# Patient Record
Sex: Female | Born: 1970 | Race: White | Hispanic: No | Marital: Married | Smoking: Former smoker
Health system: Southern US, Community
[De-identification: ages and names within clinical notes are randomized; demographics above are authoritative.]

## PROBLEM LIST (undated history)

## (undated) DIAGNOSIS — F419 Anxiety disorder, unspecified: Secondary | ICD-10-CM

## (undated) DIAGNOSIS — D219 Benign neoplasm of connective and other soft tissue, unspecified: Secondary | ICD-10-CM

## (undated) DIAGNOSIS — K219 Gastro-esophageal reflux disease without esophagitis: Secondary | ICD-10-CM

## (undated) DIAGNOSIS — J302 Other seasonal allergic rhinitis: Secondary | ICD-10-CM

## (undated) DIAGNOSIS — R519 Headache, unspecified: Secondary | ICD-10-CM

## (undated) DIAGNOSIS — R51 Headache: Secondary | ICD-10-CM

## (undated) DIAGNOSIS — F909 Attention-deficit hyperactivity disorder, unspecified type: Secondary | ICD-10-CM

## (undated) DIAGNOSIS — F329 Major depressive disorder, single episode, unspecified: Secondary | ICD-10-CM

## (undated) DIAGNOSIS — F32A Depression, unspecified: Secondary | ICD-10-CM

## (undated) DIAGNOSIS — B009 Herpesviral infection, unspecified: Secondary | ICD-10-CM

## (undated) DIAGNOSIS — T50901A Poisoning by unspecified drugs, medicaments and biological substances, accidental (unintentional), initial encounter: Secondary | ICD-10-CM

## (undated) HISTORY — PX: TONSILLECTOMY: SUR1361

## (undated) HISTORY — DX: Gastro-esophageal reflux disease without esophagitis: K21.9

---

## 1997-08-23 ENCOUNTER — Other Ambulatory Visit: Admission: RE | Admit: 1997-08-23 | Discharge: 1997-08-23 | Payer: Self-pay | Admitting: Obstetrics & Gynecology

## 1998-11-07 ENCOUNTER — Other Ambulatory Visit: Admission: RE | Admit: 1998-11-07 | Discharge: 1998-11-07 | Payer: Self-pay | Admitting: Obstetrics & Gynecology

## 1999-12-18 ENCOUNTER — Emergency Department (HOSPITAL_COMMUNITY): Admission: EM | Admit: 1999-12-18 | Discharge: 1999-12-18 | Payer: Self-pay | Admitting: Emergency Medicine

## 2002-06-11 ENCOUNTER — Other Ambulatory Visit: Admission: RE | Admit: 2002-06-11 | Discharge: 2002-06-11 | Payer: Self-pay | Admitting: Obstetrics & Gynecology

## 2003-06-29 ENCOUNTER — Other Ambulatory Visit: Admission: RE | Admit: 2003-06-29 | Discharge: 2003-06-29 | Payer: Self-pay | Admitting: Obstetrics & Gynecology

## 2004-07-01 ENCOUNTER — Ambulatory Visit (HOSPITAL_COMMUNITY): Admission: AD | Admit: 2004-07-01 | Discharge: 2004-07-01 | Payer: Self-pay | Admitting: Obstetrics and Gynecology

## 2004-10-02 ENCOUNTER — Other Ambulatory Visit: Admission: RE | Admit: 2004-10-02 | Discharge: 2004-10-02 | Payer: Self-pay | Admitting: Obstetrics and Gynecology

## 2005-04-15 HISTORY — PX: OTHER SURGICAL HISTORY: SHX169

## 2005-05-13 ENCOUNTER — Inpatient Hospital Stay (HOSPITAL_COMMUNITY): Admission: RE | Admit: 2005-05-13 | Discharge: 2005-05-13 | Payer: Self-pay | Admitting: Obstetrics and Gynecology

## 2005-08-07 ENCOUNTER — Inpatient Hospital Stay (HOSPITAL_COMMUNITY): Admission: AD | Admit: 2005-08-07 | Discharge: 2005-08-10 | Payer: Self-pay | Admitting: Obstetrics and Gynecology

## 2010-10-25 ENCOUNTER — Other Ambulatory Visit: Payer: Self-pay | Admitting: Obstetrics and Gynecology

## 2012-05-26 ENCOUNTER — Emergency Department (HOSPITAL_COMMUNITY)
Admission: EM | Admit: 2012-05-26 | Discharge: 2012-05-26 | Disposition: A | Discharge status: Home / Self Care | Payer: BC Managed Care – PPO | Attending: Emergency Medicine | Admitting: Emergency Medicine

## 2012-05-26 ENCOUNTER — Encounter (HOSPITAL_COMMUNITY): Payer: Self-pay | Admitting: Neurology

## 2012-05-26 DIAGNOSIS — R110 Nausea: Secondary | ICD-10-CM | POA: Insufficient documentation

## 2012-05-26 DIAGNOSIS — R5381 Other malaise: Secondary | ICD-10-CM | POA: Insufficient documentation

## 2012-05-26 DIAGNOSIS — F329 Major depressive disorder, single episode, unspecified: Secondary | ICD-10-CM | POA: Insufficient documentation

## 2012-05-26 DIAGNOSIS — Z79899 Other long term (current) drug therapy: Secondary | ICD-10-CM | POA: Insufficient documentation

## 2012-05-26 DIAGNOSIS — R42 Dizziness and giddiness: Secondary | ICD-10-CM | POA: Insufficient documentation

## 2012-05-26 DIAGNOSIS — T50991A Poisoning by other drugs, medicaments and biological substances, accidental (unintentional), initial encounter: Secondary | ICD-10-CM | POA: Insufficient documentation

## 2012-05-26 DIAGNOSIS — Y9389 Activity, other specified: Secondary | ICD-10-CM | POA: Insufficient documentation

## 2012-05-26 DIAGNOSIS — Y929 Unspecified place or not applicable: Secondary | ICD-10-CM | POA: Insufficient documentation

## 2012-05-26 DIAGNOSIS — F3289 Other specified depressive episodes: Secondary | ICD-10-CM | POA: Insufficient documentation

## 2012-05-26 DIAGNOSIS — F909 Attention-deficit hyperactivity disorder, unspecified type: Secondary | ICD-10-CM | POA: Insufficient documentation

## 2012-05-26 DIAGNOSIS — T50901A Poisoning by unspecified drugs, medicaments and biological substances, accidental (unintentional), initial encounter: Secondary | ICD-10-CM

## 2012-05-26 HISTORY — DX: Major depressive disorder, single episode, unspecified: F32.9

## 2012-05-26 HISTORY — DX: Attention-deficit hyperactivity disorder, unspecified type: F90.9

## 2012-05-26 HISTORY — DX: Depression, unspecified: F32.A

## 2012-05-26 NOTE — ED Notes (Signed)
Pt is alert and oriented. HR 80, denies pain. Speech is clear. Pt is ambulatory, moving all extremities. Discharged with family, pt husband states he will be staying with her today for observation. Pt told to monitor HR, return for any concerning s/s. Pt and family verbalized understanding.

## 2012-05-26 NOTE — ED Provider Notes (Signed)
History     CSN: [id number]  Arrival date & time 05/26/12  1191   First MD Initiated Contact with Patient 05/26/12 [phone]      Chief Complaint  Patient presents with  . Drug Overdose    (Consider location/radiation/quality/duration/timing/severity/associated sxs/prior treatment) Patient is a 42 y.o. female presenting with Overdose. The history is provided by the patient.  Drug Overdose This is a new problem. The current episode started today. The problem occurs rarely. The problem has been unchanged. Associated symptoms include nausea and weakness. Pertinent negatives include no abdominal pain, arthralgias, chest pain, congestion, fatigue, fever, headaches, rash or vomiting. Nothing aggravates the symptoms. She has tried nothing for the symptoms.    Past Medical History  Diagnosis Date  . ADHD (attention deficit hyperactivity disorder)   . Depression     Past Surgical History  Procedure Laterality Date  . Cesarean section      No family history on file.  History  Substance Use Topics  . Smoking status: Never Smoker   . Smokeless tobacco: Not on file  . Alcohol Use: Yes    OB History   Grav Para Term Preterm Abortions TAB SAB Ect Mult Living                  Review of Systems  Constitutional: Negative for fever and fatigue.  HENT: Negative for congestion, rhinorrhea and postnasal drip.   Eyes: Negative for photophobia and visual disturbance.  Respiratory: Negative for chest tightness, shortness of breath and wheezing.   Cardiovascular: Negative for chest pain, palpitations and leg swelling.  Gastrointestinal: Positive for nausea. Negative for vomiting, abdominal pain and diarrhea.  Genitourinary: Negative for urgency, frequency and difficulty urinating.  Musculoskeletal: Negative for back pain and arthralgias.  Skin: Negative for rash and wound.  Neurological: Positive for weakness and light-headedness. Negative for headaches.  Psychiatric/Behavioral: Negative  for confusion and agitation.    Allergies  Reglan  Home Medications   Current Outpatient Rx  Name  Route  Sig  Dispense  Refill  . amphetamine-dextroamphetamine (ADDERALL XR) 10 MG 24 hr capsule   Oral   Take 10 mg by mouth every morning.         Marland Kitchen amphetamine-dextroamphetamine (ADDERALL XR) 30 MG 24 hr capsule   Oral   Take 30 mg by mouth every morning.         Marland Kitchen atomoxetine (STRATTERA) 100 MG capsule   Oral   Take 100 mg by mouth daily.         Marland Kitchen desvenlafaxine (PRISTIQ) 50 MG 24 hr tablet   Oral   Take 50 mg by mouth daily.         Marland Kitchen ibuprofen (ADVIL,MOTRIN) 200 MG tablet   Oral   Take 400 mg by mouth every 6 (six) hours as needed for pain (for pain).         . SUMAtriptan (IMITREX) 100 MG tablet   Oral   Take 100 mg by mouth every 2 (two) hours as needed for migraine (for migraine).         . valACYclovir (VALTREX) 500 MG tablet   Oral   Take 250 mg by mouth daily.           BP 117/84  Pulse 82  Resp 18  SpO2 99%  LMP 05/25/2012  Physical Exam  Nursing note and vitals reviewed. Constitutional: She is oriented to person, place, and time. She appears well-developed and well-nourished. No distress.  HENT:  Head: Normocephalic  and atraumatic.  Mouth/Throat: Oropharynx is clear and moist.  Eyes: EOM are normal. Pupils are equal, round, and reactive to light.  Neck: Normal range of motion. Neck supple.  Cardiovascular: Normal rate, regular rhythm, normal heart sounds and intact distal pulses.   Pulmonary/Chest: Effort normal and breath sounds normal. She has no wheezes. She has no rales.  Abdominal: Soft. Bowel sounds are normal. She exhibits no distension. There is no tenderness. There is no rebound and no guarding.  Musculoskeletal: Normal range of motion. She exhibits no edema and no tenderness.  Lymphadenopathy:    She has no cervical adenopathy.  Neurological: She is alert and oriented to person, place, and time. She displays normal  reflexes. No cranial nerve deficit. She exhibits normal muscle tone. Coordination normal.  Skin: Skin is warm and dry. No rash noted.  Psychiatric: She has a normal mood and affect. Her behavior is normal.    ED Course  Procedures (including critical care time)  Labs Reviewed - No data to display No results found.   1. Accidental drug ingestion       MDM  25F here due to accidental ingestion of sumatriptan. She states she ingested eight 100 mg tablets about 3 hours prior to arrival. She thought she was taking her morning medications but instead she took the bottle of Imitrex. She is not suicidal or homicidal. She currently feels sleepy and slightly nauseated. Exam is unremarkable. I spoke with poison control who states there is no risk to serotonin syndrome or any other serious sequela. Recommended to wait for her pulse and BP to improve some before discharge, which was 99 and 147/88/  BP and HR now 117/84 and 82. Pt still feeling ok and stable for d/c home. Return precautions given.        Johnnette Gourd, MD 05/26/12 1359

## 2012-05-26 NOTE — ED Notes (Signed)
Pt states took 8 100mg  Sumatriptan tablets this morning by mistake. Usual dose is 1 100 mg tablet at night. States feeling fuzzy, tired and emotional. Pt is alert and oriented. Vitals stable. Family at bedside.

## 2012-05-29 NOTE — ED Provider Notes (Signed)
I saw and evaluated the patient, reviewed the resident's note and I agree with the findings and plan.   .Face to face Exam:  General:  Awake HEENT:  Atraumatic Resp:  Normal effort Abd:  Nondistended Neuro:No focal weakness Lymph: No adenopathy   Conna Terada L Taylore Hinde, MD 05/29/12 1128 

## 2013-02-11 ENCOUNTER — Encounter (HOSPITAL_COMMUNITY): Payer: Self-pay | Admitting: Emergency Medicine

## 2013-02-11 ENCOUNTER — Emergency Department (HOSPITAL_COMMUNITY)
Admission: EM | Admit: 2013-02-11 | Discharge: 2013-02-11 | Disposition: A | Discharge status: Home / Self Care | Payer: BC Managed Care – PPO | Attending: Emergency Medicine | Admitting: Emergency Medicine

## 2013-02-11 DIAGNOSIS — F3289 Other specified depressive episodes: Secondary | ICD-10-CM | POA: Insufficient documentation

## 2013-02-11 DIAGNOSIS — Y9289 Other specified places as the place of occurrence of the external cause: Secondary | ICD-10-CM | POA: Insufficient documentation

## 2013-02-11 DIAGNOSIS — T50901A Poisoning by unspecified drugs, medicaments and biological substances, accidental (unintentional), initial encounter: Secondary | ICD-10-CM

## 2013-02-11 DIAGNOSIS — Z3202 Encounter for pregnancy test, result negative: Secondary | ICD-10-CM | POA: Insufficient documentation

## 2013-02-11 DIAGNOSIS — R42 Dizziness and giddiness: Secondary | ICD-10-CM | POA: Insufficient documentation

## 2013-02-11 DIAGNOSIS — F329 Major depressive disorder, single episode, unspecified: Secondary | ICD-10-CM | POA: Insufficient documentation

## 2013-02-11 DIAGNOSIS — Y939 Activity, unspecified: Secondary | ICD-10-CM | POA: Insufficient documentation

## 2013-02-11 DIAGNOSIS — Z79899 Other long term (current) drug therapy: Secondary | ICD-10-CM | POA: Insufficient documentation

## 2013-02-11 DIAGNOSIS — F909 Attention-deficit hyperactivity disorder, unspecified type: Secondary | ICD-10-CM | POA: Insufficient documentation

## 2013-02-11 DIAGNOSIS — T44901A Poisoning by unspecified drugs primarily affecting the autonomic nervous system, accidental (unintentional), initial encounter: Secondary | ICD-10-CM | POA: Insufficient documentation

## 2013-02-11 LAB — CBC WITH DIFFERENTIAL/PLATELET
Basophils Absolute: 0 10*3/uL (ref 0.0–0.1)
HCT: 37.7 % (ref 36.0–46.0)
Hemoglobin: 13.2 g/dL (ref 12.0–15.0)
Lymphocytes Relative: 10 % — ABNORMAL LOW (ref 12–46)
Lymphs Abs: 1.1 10*3/uL (ref 0.7–4.0)
MCH: 31.3 pg (ref 26.0–34.0)
Monocytes Absolute: 0.3 10*3/uL (ref 0.1–1.0)
Monocytes Relative: 3 % (ref 3–12)
Neutro Abs: 9.4 10*3/uL — ABNORMAL HIGH (ref 1.7–7.7)
Neutrophils Relative %: 87 % — ABNORMAL HIGH (ref 43–77)
Platelets: 287 10*3/uL (ref 150–400)
WBC: 10.9 10*3/uL — ABNORMAL HIGH (ref 4.0–10.5)

## 2013-02-11 LAB — COMPREHENSIVE METABOLIC PANEL
ALT: 16 U/L (ref 0–35)
AST: 19 U/L (ref 0–37)
Albumin: 3.8 g/dL (ref 3.5–5.2)
Alkaline Phosphatase: 67 U/L (ref 39–117)
BUN: 10 mg/dL (ref 6–23)
GFR calc Af Amer: >90 mL/min (ref >90)
GFR calc non Af Amer: >90 mL/min (ref >90)
Sodium: 135 mEq/L (ref 135–145)

## 2013-02-11 LAB — ACETAMINOPHEN LEVEL: Acetaminophen (Tylenol), Serum: <15 ug/mL (ref 10–30)

## 2013-02-11 MED ORDER — SODIUM CHLORIDE 0.9 % IV BOLUS (SEPSIS)
2000.0000 mL | Freq: Once | INTRAVENOUS | Status: AC
  Administered 2013-02-11: 1000 mL via INTRAVENOUS

## 2013-02-11 NOTE — ED Provider Notes (Signed)
CSN: [id number]     Arrival date & time 02/11/13  0935 History   First MD Initiated Contact with Patient 02/11/13 [phone]     Chief Complaint  Patient presents with  . Drug Overdose   (Consider location/radiation/quality/duration/timing/severity/associated sxs/prior Treatment) HPI This 42 year old female presents with accidental drug ingestion overdose today. She said the wrong pill bottle out by her bedside this morning. Usually she has a full bottle by her bedside at 5:30 in the morning she takes multiple medications. Today at 5:30 she acts in mind we took the medications out of a bottle by her bedside, she then woke up later to pick is ready for school, she felt lightheaded at that time so laid back down again, she woke up again this morning and realized when she looked at the pool bottle by her bedside it was not her usual morning medicine until bottle it was instead her bottle of Strattera pills and she had taken multiple Strattera pills by accident. She feels generally weak and shaky with decreased concentration this morning and lightheadedness but has no vertigo no altered mental status no fever no change in speech or vision swallowing or understanding and no chest pain shortness breath abdominal pain vomiting no suicidal or homicidal ideation no hallucinations no syncope. There is no treatment prior to arrival. Past Medical History  Diagnosis Date  . ADHD (attention deficit hyperactivity disorder)   . Depression    Past Surgical History  Procedure Laterality Date  . Cesarean section     No family history on file. History  Substance Use Topics  . Smoking status: Never Smoker   . Smokeless tobacco: Not on file  . Alcohol Use: Yes   OB History   Grav Para Term Preterm Abortions TAB SAB Ect Mult Living                 Review of Systems 10 Systems reviewed and are negative for acute change except as noted in the HPI. Allergies  Reglan  Home Medications   Current Outpatient Rx   Name  Route  Sig  Dispense  Refill  . ALPRAZolam (XANAX) 0.25 MG tablet   Oral   Take 0.25 mg by mouth 3 (three) times daily as needed for anxiety.         Marland Kitchen amphetamine-dextroamphetamine (ADDERALL XR) 10 MG 24 hr capsule   Oral   Take 10 mg by mouth every morning.         Marland Kitchen amphetamine-dextroamphetamine (ADDERALL XR) 30 MG 24 hr capsule   Oral   Take 30 mg by mouth every morning.         Marland Kitchen atomoxetine (STRATTERA) 100 MG capsule   Oral   Take 100 mg by mouth daily.         Marland Kitchen desvenlafaxine (PRISTIQ) 50 MG 24 hr tablet   Oral   Take 50 mg by mouth daily.         Marland Kitchen FLUoxetine (PROZAC) 10 MG capsule   Oral   Take 10 mg by mouth daily as needed (Takes during menstrual cycle).         Marland Kitchen ibuprofen (ADVIL,MOTRIN) 200 MG tablet   Oral   Take 400 mg by mouth every 6 (six) hours as needed for pain (for pain).         . SUMAtriptan (IMITREX) 100 MG tablet   Oral   Take 100 mg by mouth every 2 (two) hours as needed for migraine. May repeat in 2 hours if  headache persists or recurs.         . valACYclovir (VALTREX) 500 MG tablet   Oral   Take 250 mg by mouth daily.          BP 112/73  Pulse 80  Temp(Src) 97.8 F (36.6 C) (Oral)  Resp 15  Ht 5\' 4"  (1.626 m)  Wt 140 lb (63.504 kg)  BMI 24.02 kg/m2  SpO2 100% Physical Exam  Nursing note and vitals reviewed. Constitutional:  Awake, alert, nontoxic appearance.  HENT:  Head: Atraumatic.  Eyes: Right eye exhibits no discharge. Left eye exhibits no discharge.  Neck: Neck supple.  Cardiovascular: Normal rate and regular rhythm.   No murmur heard. Pulmonary/Chest: Effort normal and breath sounds normal. No respiratory distress. She has no wheezes. She has no rales. She exhibits no tenderness.  Abdominal: Soft. Bowel sounds are normal. She exhibits no distension. There is no tenderness. There is no rebound and no guarding.  Musculoskeletal: She exhibits no tenderness.  Baseline ROM, no obvious new focal  weakness.  Neurological: She is alert.  Mental status and motor strength appears baseline for patient and situation.  Skin: No rash noted.  Psychiatric: She has a normal mood and affect.    ED Course  Procedures (including critical care time) Patient / Family / Caregiver informed of clinical course, understand medical decision-making process, and agree with plan.Pt stable in ED with no significant deterioration in condition.Pt feels improved after observation and/or treatment in ED. Labs Review Labs Reviewed  CBC WITH DIFFERENTIAL - Abnormal; Notable for the following:    WBC 10.9 (*)    Neutrophils Relative % 87 (*)    Neutro Abs 9.4 (*)    Lymphocytes Relative 10 (*)    All other components within normal limits  SALICYLATE LEVEL - Abnormal; Notable for the following:    Salicylate Lvl <2.0 (*)    All other components within normal limits  COMPREHENSIVE METABOLIC PANEL  ACETAMINOPHEN LEVEL  CK  POCT PREGNANCY, URINE   Imaging Review No results found.  EKG Interpretation     Ventricular Rate:  86 PR Interval:  141 QRS Duration: 85 QT Interval:  406 QTC Calculation: 486 R Axis:   85 Text Interpretation:  Sinus rhythm Low voltage, precordial leads Nonspecific T abnormalities, lateral leads Borderline prolonged QT interval No previous ECGs available            MDM   1. Accidental overdose, initial encounter    I doubt any other EMC precluding discharge at this time including, but not necessarily limited to the following:SI.    Hurman Horn, MD 02/14/13 2023

## 2013-02-11 NOTE — ED Notes (Signed)
Unable to get temperature.   Tried oral and axillary.  Patient feels cool to the touch.

## 2013-02-11 NOTE — ED Notes (Signed)
Pt stated she felt whoozy when standing to use the bedside commode. Reported to nurse [facility]

## 2013-02-11 NOTE — ED Notes (Signed)
Pt ambulated to restroom with no issues.  

## 2013-02-11 NOTE — ED Notes (Signed)
Spoke with poison control--- states ok to discharge if no further symptoms

## 2013-02-11 NOTE — ED Notes (Addendum)
Spoke to [place] from [employer] regarding plan of monitoring/care. Dr. Fonnie Jarvis and patient updated.

## 2013-02-11 NOTE — ED Notes (Addendum)
Per EMS: Pt took an 7 pills Strattera, for a total of 700 mg at 0500 this AM. Pt reports it was mixed in with normal medications and was accidental. Pt also took Valtrex and Aderall this AM. 130/96. 100 bpm. 100% RA. BG 237. AO x4, mild lethargy noted.

## 2013-04-06 ENCOUNTER — Other Ambulatory Visit: Payer: Self-pay | Admitting: Obstetrics and Gynecology

## 2014-05-04 ENCOUNTER — Other Ambulatory Visit: Payer: Self-pay | Admitting: Obstetrics and Gynecology

## 2014-05-05 LAB — CYTOLOGY - PAP

## 2014-07-29 NOTE — Patient Instructions (Addendum)
   Your procedure is scheduled on:  Wednesday, April 27  Enter through the [employer] [facility] at: 6 AM Pick up the phone at the desk and dial [phone] and inform us of your arrival.  Please call this number if you have any problems the morning of surgery: [phone]  Remember: Do not eat or drink after midnight: Tuesday Take these medicines the morning of surgery with a SIP OF WATER: clarinex if needed, pristiq, prozac, valtrex and xanax if needed.  Do not wear jewelry, make-up, or FINGER nail polish No metal in your hair or on your body. Do not wear lotions, powders, perfumes.  You may wear deodorant.  Do not bring valuables to the hospital. Contacts, dentures or bridgework may not be worn into surgery.  Leave suitcase in the car. After Surgery it may be brought to your room. For patients being admitted to the hospital, checkout time is 11:00am the day of discharge.  Home with husband Percell Miller cell [phone]

## 2014-08-02 ENCOUNTER — Encounter (HOSPITAL_COMMUNITY): Payer: Self-pay

## 2014-08-02 ENCOUNTER — Encounter (HOSPITAL_COMMUNITY)
Admission: RE | Admit: 2014-08-02 | Discharge: 2014-08-02 | Disposition: A | Discharge status: Home / Self Care | Payer: BLUE CROSS/BLUE SHIELD | Source: Ambulatory Visit | Attending: Obstetrics and Gynecology | Admitting: Obstetrics and Gynecology

## 2014-08-02 DIAGNOSIS — Z01818 Encounter for other preprocedural examination: Secondary | ICD-10-CM | POA: Insufficient documentation

## 2014-08-02 HISTORY — DX: Other seasonal allergic rhinitis: J30.2

## 2014-08-02 HISTORY — DX: Headache: R51

## 2014-08-02 HISTORY — DX: Anxiety disorder, unspecified: F41.9

## 2014-08-02 HISTORY — DX: Poisoning by unspecified drugs, medicaments and biological substances, accidental (unintentional), initial encounter: T50.901A

## 2014-08-02 HISTORY — DX: Headache, unspecified: R51.9

## 2014-08-02 HISTORY — DX: Benign neoplasm of connective and other soft tissue, unspecified: D21.9

## 2014-08-02 HISTORY — DX: Herpesviral infection, unspecified: B00.9

## 2014-08-02 LAB — CBC
HCT: 33.6 % — ABNORMAL LOW (ref 36.0–46.0)
HEMOGLOBIN: 10.8 g/dL — AB (ref 12.0–15.0)
MCH: 26.9 pg (ref 26.0–34.0)
MCHC: 32.1 g/dL (ref 30.0–36.0)
MCV: 83.8 fL (ref 78.0–100.0)
Platelets: 389 10*3/uL (ref 150–400)
RBC: 4.01 MIL/uL (ref 3.87–5.11)
RDW: 13.6 % (ref 11.5–15.5)
WBC: 5.7 10*3/uL (ref 4.0–10.5)

## 2014-08-04 ENCOUNTER — Other Ambulatory Visit: Payer: Self-pay | Admitting: Obstetrics and Gynecology

## 2014-08-04 NOTE — H&P (Signed)
44 y.o. yo complains of severe heavy periods and symptomatic fibroid uterus.  Pt. c/o painful and heavy cycles/ tb//. Mirena inserted in 10-2010. Periods are about same - consistently heavy, but not worse. However, cramps are worsening. Tampons are falling out because of how heavy. Using cup and at least doesn't leak all the way through. Prozac helps PMDD. Has migraines- may worsen with OCPs.  She also c/o weakness and fatigue.  [place] 9x6x7; large 5 cm fibroid submucosal displacing EM; IUD was just inside internal OS- IUD was removed.  Period again right after last visit. Then had almost like hemorrhage. Feels very fatigued. FEels really bad.She does not have significant SUI sx.  Past Medical History  Diagnosis Date  . ADHD (attention deficit hyperactivity disorder)   . Depression   . Drug overdose 05/26/12, 02/11/13    history x 2 - accidental drug overdose  . Anxiety   . Herpes   . Headache   . Seasonal allergies   . Fibroids    Past Surgical History  Procedure Laterality Date  . Cesarean section      x 1  . Tonsillectomy    . Right foot surgery  2007    History   Social History  . Marital Status: Single    Spouse Name: N/A  . Number of Children: N/A  . Years of Education: N/A   Occupational History  . Not on file.   Social History Main Topics  . Smoking status: Former Smoker -- 0.25 packs/day for 10 years    Types: Cigarettes    Quit date: 04/16/2003  . Smokeless tobacco: Never Used  . Alcohol Use: 8.4 oz/week    14 Glasses of wine per week     Comment: daily wine/beer   . Drug Use: Yes    Special: Marijuana     Comment: Last use April 2016  . Sexual Activity: Yes    Birth Control/ Protection: None   Other Topics Concern  . Not on file   Social History Narrative    No current facility-administered medications on file prior to encounter.   Current Outpatient Prescriptions on File Prior to Encounter  Medication Sig Dispense Refill  . ALPRAZolam (XANAX) 0.25 MG  tablet Take 0.25 mg by mouth 3 (three) times daily as needed for anxiety.    Marland Kitchen amphetamine-dextroamphetamine (ADDERALL XR) 10 MG 24 hr capsule Take 10 mg by mouth every morning.    Marland Kitchen amphetamine-dextroamphetamine (ADDERALL XR) 30 MG 24 hr capsule Take 30 mg by mouth every morning.    Marland Kitchen atomoxetine (STRATTERA) 100 MG capsule Take 100 mg by mouth daily.    Marland Kitchen desvenlafaxine (PRISTIQ) 50 MG 24 hr tablet Take 50 mg by mouth daily.    Marland Kitchen FLUoxetine (PROZAC) 10 MG capsule Take 10 mg by mouth daily as needed (Takes during menstrual cycle).    Marland Kitchen ibuprofen (ADVIL,MOTRIN) 200 MG tablet Take 400 mg by mouth every 6 (six) hours as needed for pain (for pain).    . SUMAtriptan (IMITREX) 100 MG tablet Take 100 mg by mouth every 2 (two) hours as needed for migraine. May repeat in 2 hours if headache persists or recurs.    . valACYclovir (VALTREX) 500 MG tablet Take 250-500 mg by mouth See admin instructions. Takes 250 mg daily and takes 500 mg for outbreaks.      Allergies  Allergen Reactions  . Morphine And Related Itching  . Reglan [Metoclopramide] Nausea And Vomiting and Other (See Comments)    hyperactivity    @  [id number]@  Lungs: clear to ascultation Cor:  RRR Abdomen:  soft, nontender, nondistended. Ex:  no cords, erythema Pelvic: Female Genitalia: Vulva: no masses, atrophy, or lesions. Vagina: no tenderness, erythema, cystocele, rectocele, abnormal vaginal discharge, or vesicle(s) or ulcers. Cervix: no discharge or cervical motion tenderness and grossly normal and sample taken for a Pap smear. Uterus: normal shape and size (7; strings NOT seen) and midline, non-tender, and no uterine prolapse. Bladder/Urethra: no urethral discharge or mass and normal meatus, bladder non distended, and Urethra well supported. Adnexa/Parametria: no parametrial tenderness or mass and no adnexal tenderness or ovarian mass.   A:  Symptomatic fibroid uterus with 5 cm submucosal fibroid and IUD that was removed after slipping  out of place.  Does not tolerate OCPs  For Robotic assisted TLH/salpingectomies/poss BSO/cysto.    P: All risks, benefits and alternatives d/w patient and she desires to proceed.  Patient has undergone a modified bowel prep and will receive preop antibiotics and SCDs during the operation.     Chrishawn Kring A

## 2014-08-09 MED ORDER — DEXTROSE 5 % IV SOLN
2.0000 g | INTRAVENOUS | Status: AC
  Administered 2014-08-10: 2 g via INTRAVENOUS
  Filled 2014-08-09: qty 2

## 2014-08-10 ENCOUNTER — Ambulatory Visit (HOSPITAL_COMMUNITY)
Admission: RE | Admit: 2014-08-10 | Discharge: 2014-08-11 | Disposition: A | Discharge status: Home / Self Care | Payer: BLUE CROSS/BLUE SHIELD | Source: Ambulatory Visit | Attending: Obstetrics and Gynecology | Admitting: Obstetrics and Gynecology

## 2014-08-10 ENCOUNTER — Ambulatory Visit (HOSPITAL_COMMUNITY): Payer: BLUE CROSS/BLUE SHIELD | Admitting: Anesthesiology

## 2014-08-10 ENCOUNTER — Encounter (HOSPITAL_COMMUNITY)
Admission: RE | Disposition: A | Discharge status: Home / Self Care | Payer: Self-pay | Source: Ambulatory Visit | Attending: Obstetrics and Gynecology

## 2014-08-10 ENCOUNTER — Encounter (HOSPITAL_COMMUNITY): Payer: Self-pay | Admitting: *Deleted

## 2014-08-10 DIAGNOSIS — Z9889 Other specified postprocedural states: Secondary | ICD-10-CM

## 2014-08-10 DIAGNOSIS — Z885 Allergy status to narcotic agent status: Secondary | ICD-10-CM | POA: Insufficient documentation

## 2014-08-10 DIAGNOSIS — F329 Major depressive disorder, single episode, unspecified: Secondary | ICD-10-CM | POA: Diagnosis not present

## 2014-08-10 DIAGNOSIS — Z87891 Personal history of nicotine dependence: Secondary | ICD-10-CM | POA: Diagnosis not present

## 2014-08-10 DIAGNOSIS — F1210 Cannabis abuse, uncomplicated: Secondary | ICD-10-CM | POA: Diagnosis not present

## 2014-08-10 DIAGNOSIS — D259 Leiomyoma of uterus, unspecified: Secondary | ICD-10-CM | POA: Insufficient documentation

## 2014-08-10 DIAGNOSIS — N920 Excessive and frequent menstruation with regular cycle: Secondary | ICD-10-CM | POA: Diagnosis present

## 2014-08-10 DIAGNOSIS — Z888 Allergy status to other drugs, medicaments and biological substances status: Secondary | ICD-10-CM | POA: Insufficient documentation

## 2014-08-10 DIAGNOSIS — F419 Anxiety disorder, unspecified: Secondary | ICD-10-CM | POA: Diagnosis not present

## 2014-08-10 HISTORY — PX: CYSTOSCOPY: SHX5120

## 2014-08-10 HISTORY — PX: ROBOTIC ASSISTED TOTAL HYSTERECTOMY: SHX6085

## 2014-08-10 HISTORY — PX: BILATERAL SALPINGECTOMY: SHX5743

## 2014-08-10 LAB — PREGNANCY, URINE: Preg Test, Ur: NEGATIVE

## 2014-08-10 SURGERY — ROBOTIC ASSISTED TOTAL HYSTERECTOMY
Anesthesia: General | Site: Urethra

## 2014-08-10 MED ORDER — ONDANSETRON HCL 4 MG/2ML IJ SOLN: 4.0000 mg | Freq: Four times a day (QID) | INTRAMUSCULAR | Status: DC | PRN

## 2014-08-10 MED ORDER — ATOMOXETINE HCL 60 MG PO CAPS: 100.0000 mg | ORAL_CAPSULE | Freq: Every day | ORAL | Status: DC

## 2014-08-10 MED ORDER — PROPOFOL 10 MG/ML IV BOLUS
INTRAVENOUS | Status: DC | PRN
  Administered 2014-08-10: 160 mg via INTRAVENOUS

## 2014-08-10 MED ORDER — LACTATED RINGERS IR SOLN
Status: DC | PRN
Start: 2014-08-10 — End: 2014-08-10
  Administered 2014-08-10: 3000 mL

## 2014-08-10 MED ORDER — ALPRAZOLAM 0.25 MG PO TABS: 0.2500 mg | ORAL_TABLET | Freq: Three times a day (TID) | ORAL | Status: DC | PRN

## 2014-08-10 MED ORDER — SUMATRIPTAN SUCCINATE 100 MG PO TABS
100.0000 mg | ORAL_TABLET | ORAL | Status: DC | PRN
  Filled 2014-08-10: qty 1

## 2014-08-10 MED ORDER — STERILE WATER FOR IRRIGATION IR SOLN
Status: DC | PRN
  Administered 2014-08-10: 3000 mL

## 2014-08-10 MED ORDER — FENTANYL CITRATE (PF) 100 MCG/2ML IJ SOLN
INTRAMUSCULAR | Status: AC
  Filled 2014-08-10: qty 2

## 2014-08-10 MED ORDER — HYDROMORPHONE HCL 1 MG/ML IJ SOLN
INTRAMUSCULAR | Status: AC
Start: 2014-08-10 — End: 2014-08-10
  Filled 2014-08-10: qty 1

## 2014-08-10 MED ORDER — BUPIVACAINE LIPOSOME 1.3 % IJ SUSP
20.0000 mL | Freq: Once | INTRAMUSCULAR | Status: AC
  Administered 2014-08-10: 20 mL
  Filled 2014-08-10: qty 20

## 2014-08-10 MED ORDER — LIDOCAINE HCL (CARDIAC) 20 MG/ML IV SOLN
INTRAVENOUS | Status: AC
  Filled 2014-08-10: qty 5

## 2014-08-10 MED ORDER — AMPHETAMINE-DEXTROAMPHET ER 30 MG PO CP24: 30.0000 mg | ORAL_CAPSULE | Freq: Every morning | ORAL | Status: DC

## 2014-08-10 MED ORDER — MIDAZOLAM HCL 2 MG/2ML IJ SOLN
INTRAMUSCULAR | Status: DC | PRN
  Administered 2014-08-10: 2 mg via INTRAVENOUS

## 2014-08-10 MED ORDER — ONDANSETRON HCL 4 MG/2ML IJ SOLN
INTRAMUSCULAR | Status: AC
  Filled 2014-08-10: qty 2

## 2014-08-10 MED ORDER — ONDANSETRON HCL 4 MG PO TABS: 4.0000 mg | ORAL_TABLET | Freq: Four times a day (QID) | ORAL | Status: DC | PRN

## 2014-08-10 MED ORDER — NEOSTIGMINE METHYLSULFATE 10 MG/10ML IV SOLN
INTRAVENOUS | Status: AC
  Filled 2014-08-10: qty 1

## 2014-08-10 MED ORDER — DEXAMETHASONE SODIUM PHOSPHATE 4 MG/ML IJ SOLN
INTRAMUSCULAR | Status: AC
  Filled 2014-08-10: qty 1

## 2014-08-10 MED ORDER — DESVENLAFAXINE SUCCINATE ER 50 MG PO TB24: 50.0000 mg | ORAL_TABLET | Freq: Every day | ORAL | Status: DC

## 2014-08-10 MED ORDER — KETOROLAC TROMETHAMINE 30 MG/ML IJ SOLN
INTRAMUSCULAR | Status: DC | PRN
  Administered 2014-08-10: 30 mg via INTRAVENOUS

## 2014-08-10 MED ORDER — ROCURONIUM BROMIDE 100 MG/10ML IV SOLN
INTRAVENOUS | Status: AC
Start: 2014-08-10 — End: 2014-08-10
  Filled 2014-08-10: qty 1

## 2014-08-10 MED ORDER — DEXAMETHASONE SODIUM PHOSPHATE 10 MG/ML IJ SOLN
INTRAMUSCULAR | Status: DC | PRN
  Administered 2014-08-10: 4 mg via INTRAVENOUS

## 2014-08-10 MED ORDER — SCOPOLAMINE 1 MG/3DAYS TD PT72
MEDICATED_PATCH | TRANSDERMAL | Status: AC
  Administered 2014-08-10: 1.5 mg via TRANSDERMAL
  Filled 2014-08-10: qty 1

## 2014-08-10 MED ORDER — ONDANSETRON HCL 4 MG/2ML IJ SOLN
INTRAMUSCULAR | Status: DC | PRN
  Administered 2014-08-10: 4 mg via INTRAVENOUS

## 2014-08-10 MED ORDER — AMPHETAMINE-DEXTROAMPHET ER 10 MG PO CP24: 10.0000 mg | ORAL_CAPSULE | ORAL | Status: DC

## 2014-08-10 MED ORDER — LACTATED RINGERS IV SOLN
INTRAVENOUS | Status: DC
  Administered 2014-08-10 (×3): via INTRAVENOUS

## 2014-08-10 MED ORDER — IBUPROFEN 800 MG PO TABS
800.0000 mg | ORAL_TABLET | Freq: Three times a day (TID) | ORAL | Status: DC | PRN
  Administered 2014-08-10: 800 mg via ORAL
  Filled 2014-08-10: qty 1

## 2014-08-10 MED ORDER — STERILE WATER FOR IRRIGATION IR SOLN
Status: DC | PRN
  Administered 2014-08-10: 1000 mL

## 2014-08-10 MED ORDER — MIDAZOLAM HCL 2 MG/2ML IJ SOLN
INTRAMUSCULAR | Status: AC
  Filled 2014-08-10: qty 2

## 2014-08-10 MED ORDER — GLYCOPYRROLATE 0.2 MG/ML IJ SOLN
INTRAMUSCULAR | Status: DC | PRN
  Administered 2014-08-10: .6 mg via INTRAVENOUS

## 2014-08-10 MED ORDER — LIDOCAINE HCL (CARDIAC) 20 MG/ML IV SOLN
INTRAVENOUS | Status: DC | PRN
  Administered 2014-08-10: 50 mg via INTRAVENOUS

## 2014-08-10 MED ORDER — HYDROMORPHONE HCL 1 MG/ML IJ SOLN
INTRAMUSCULAR | Status: AC
  Filled 2014-08-10: qty 1

## 2014-08-10 MED ORDER — KETOROLAC TROMETHAMINE 30 MG/ML IJ SOLN
INTRAMUSCULAR | Status: AC
  Filled 2014-08-10: qty 1

## 2014-08-10 MED ORDER — FENTANYL CITRATE (PF) 100 MCG/2ML IJ SOLN
INTRAMUSCULAR | Status: DC | PRN
  Administered 2014-08-10 (×3): 100 ug via INTRAVENOUS
  Administered 2014-08-10: 50 ug via INTRAVENOUS

## 2014-08-10 MED ORDER — HYDROMORPHONE HCL 1 MG/ML IJ SOLN
0.2500 mg | INTRAMUSCULAR | Status: DC | PRN
  Administered 2014-08-10 (×4): 0.5 mg via INTRAVENOUS

## 2014-08-10 MED ORDER — ROCURONIUM BROMIDE 100 MG/10ML IV SOLN
INTRAVENOUS | Status: DC | PRN
  Administered 2014-08-10: 50 mg via INTRAVENOUS
  Administered 2014-08-10: 20 mg via INTRAVENOUS

## 2014-08-10 MED ORDER — OXYCODONE-ACETAMINOPHEN 5-325 MG PO TABS
1.0000 | ORAL_TABLET | ORAL | Status: DC | PRN
  Administered 2014-08-10 – 2014-08-11 (×4): 1 via ORAL
  Administered 2014-08-11: 2 via ORAL
  Filled 2014-08-10 (×3): qty 1
  Filled 2014-08-10: qty 2
  Filled 2014-08-10: qty 1
  Filled 2014-08-10: qty 2

## 2014-08-10 MED ORDER — ACETAMINOPHEN 10 MG/ML IV SOLN
1000.0000 mg | Freq: Once | INTRAVENOUS | Status: AC
  Administered 2014-08-10: 1000 mg via INTRAVENOUS
  Filled 2014-08-10: qty 100

## 2014-08-10 MED ORDER — PROPOFOL 10 MG/ML IV BOLUS
INTRAVENOUS | Status: AC
  Filled 2014-08-10: qty 20

## 2014-08-10 MED ORDER — GLYCOPYRROLATE 0.2 MG/ML IJ SOLN
INTRAMUSCULAR | Status: AC
  Filled 2014-08-10: qty 3

## 2014-08-10 MED ORDER — METHYLENE BLUE 1 % INJ SOLN
INTRAMUSCULAR | Status: AC
  Filled 2014-08-10: qty 1

## 2014-08-10 MED ORDER — FENTANYL CITRATE (PF) 250 MCG/5ML IJ SOLN
INTRAMUSCULAR | Status: AC
  Filled 2014-08-10: qty 5

## 2014-08-10 MED ORDER — MENTHOL 3 MG MT LOZG: 1.0000 | LOZENGE | OROMUCOSAL | Status: DC | PRN

## 2014-08-10 MED ORDER — LORATADINE 10 MG PO TABS
10.0000 mg | ORAL_TABLET | Freq: Every day | ORAL | Status: DC
  Administered 2014-08-11: 10 mg via ORAL
  Filled 2014-08-10 (×2): qty 1

## 2014-08-10 MED ORDER — NEOSTIGMINE METHYLSULFATE 10 MG/10ML IV SOLN
INTRAVENOUS | Status: DC | PRN
  Administered 2014-08-10: 4 mg via INTRAVENOUS

## 2014-08-10 MED ORDER — FLUOXETINE HCL 10 MG PO CAPS
10.0000 mg | ORAL_CAPSULE | Freq: Every day | ORAL | Status: DC | PRN
  Filled 2014-08-10: qty 1

## 2014-08-10 MED ORDER — KETOROLAC TROMETHAMINE 30 MG/ML IJ SOLN: 30.0000 mg | Freq: Four times a day (QID) | INTRAMUSCULAR | Status: DC

## 2014-08-10 MED ORDER — SCOPOLAMINE 1 MG/3DAYS TD PT72
1.0000 | MEDICATED_PATCH | Freq: Once | TRANSDERMAL | Status: DC
  Administered 2014-08-10: 1.5 mg via TRANSDERMAL

## 2014-08-10 SURGICAL SUPPLY — 72 items
BARRIER ADHS 3X4 INTERCEED (GAUZE/BANDAGES/DRESSINGS) ×4 IMPLANT
BENZOIN TINCTURE PRP APPL 2/3 (GAUZE/BANDAGES/DRESSINGS) ×4 IMPLANT
CLOSURE WOUND 1/2 X4 (GAUZE/BANDAGES/DRESSINGS) ×1
CLOTH BEACON ORANGE TIMEOUT ST (SAFETY) ×4 IMPLANT
CONT PATH 16OZ SNAP LID 3702 (MISCELLANEOUS) ×4 IMPLANT
COVER BACK TABLE 60X90IN (DRAPES) ×8 IMPLANT
COVER TIP SHEARS 8 DVNC (MISCELLANEOUS) ×2 IMPLANT
COVER TIP SHEARS 8MM DA VINCI (MISCELLANEOUS) ×2
DECANTER SPIKE VIAL GLASS SM (MISCELLANEOUS) ×4 IMPLANT
DRAPE WARM FLUID 44X44 (DRAPE) ×4 IMPLANT
DRSG OPSITE POSTOP 3X4 (GAUZE/BANDAGES/DRESSINGS) ×4 IMPLANT
DURAPREP 26ML APPLICATOR (WOUND CARE) ×4 IMPLANT
ELECT REM PT RETURN 9FT ADLT (ELECTROSURGICAL) ×4
ELECTRODE REM PT RTRN 9FT ADLT (ELECTROSURGICAL) ×2 IMPLANT
GAUZE VASELINE 3X9 (GAUZE/BANDAGES/DRESSINGS) IMPLANT
GLOVE BIO SURGEON STRL SZ 6.5 (GLOVE) ×3 IMPLANT
GLOVE BIO SURGEON STRL SZ7 (GLOVE) ×8 IMPLANT
GLOVE BIO SURGEONS STRL SZ 6.5 (GLOVE) ×1
GLOVE BIOGEL PI IND STRL 6.5 (GLOVE) ×2 IMPLANT
GLOVE BIOGEL PI IND STRL 7.0 (GLOVE) ×4 IMPLANT
GLOVE BIOGEL PI INDICATOR 6.5 (GLOVE) ×2
GLOVE BIOGEL PI INDICATOR 7.0 (GLOVE) ×4
GLOVE ECLIPSE 6.5 STRL STRAW (GLOVE) ×12 IMPLANT
GRASPER BIPOLAR FEN DA VINCI (INSTRUMENTS)
GRASPER BPLR FEN DVNC (INSTRUMENTS) IMPLANT
GYRUS RUMI II 2.5CM BLUE (DISPOSABLE)
GYRUS RUMI II 3.5CM BLUE (DISPOSABLE) ×4
GYRUS RUMI II 4.0CM BLUE (DISPOSABLE)
KIT ACCESSORY DA VINCI DISP (KITS) ×2
KIT ACCESSORY DVNC DISP (KITS) ×2 IMPLANT
LEGGING LITHOTOMY PAIR STRL (DRAPES) ×4 IMPLANT
LIQUID BAND (GAUZE/BANDAGES/DRESSINGS) ×4 IMPLANT
MANIPULATOR UTERINE 4.5 ZUMI (MISCELLANEOUS) ×4 IMPLANT
NEEDLE INSUFFLATION 120MM (ENDOMECHANICALS) ×4 IMPLANT
OCCLUDER COLPOPNEUMO (BALLOONS) ×4 IMPLANT
PACK ROBOT WH (CUSTOM PROCEDURE TRAY) ×4 IMPLANT
PACK ROBOTIC GOWN (GOWN DISPOSABLE) ×4 IMPLANT
PAD POSITIONER PINK NONSTERILE (MISCELLANEOUS) ×4 IMPLANT
PAD PREP 24X48 CUFFED NSTRL (MISCELLANEOUS) ×8 IMPLANT
RUMI II 3.0CM BLUE KOH-EFFICIE (DISPOSABLE) IMPLANT
RUMI II GYRUS 2.5CM BLUE (DISPOSABLE) IMPLANT
RUMI II GYRUS 3.5CM BLUE (DISPOSABLE) ×2 IMPLANT
RUMI II GYRUS 4.0CM BLUE (DISPOSABLE) IMPLANT
SET CYSTO W/LG BORE CLAMP LF (SET/KITS/TRAYS/PACK) ×4 IMPLANT
SET IRRIG TUBING LAPAROSCOPIC (IRRIGATION / IRRIGATOR) ×4 IMPLANT
SET TRI-LUMEN FLTR TB AIRSEAL (TUBING) ×4 IMPLANT
STRIP CLOSURE SKIN 1/2X4 (GAUZE/BANDAGES/DRESSINGS) ×3 IMPLANT
SUT DVC VLOC 180 0 12IN GS21 (SUTURE) ×4
SUT VIC AB 0 CT1 27 (SUTURE)
SUT VIC AB 0 CT1 27XBRD ANBCTR (SUTURE) IMPLANT
SUT VIC AB 2-0 CT2 27 (SUTURE) ×8 IMPLANT
SUT VIC AB 2-0 UR6 27 (SUTURE) ×4 IMPLANT
SUT VICRYL RAPIDE 3 0 (SUTURE) ×8 IMPLANT
SUT VLOC 180 0 9IN  GS21 (SUTURE)
SUT VLOC 180 0 9IN GS21 (SUTURE) IMPLANT
SUTURE DVC VLC 180 0 12IN GS21 (SUTURE) ×2 IMPLANT
SYR 50ML LL SCALE MARK (SYRINGE) ×4 IMPLANT
SYSTEM CONVERTIBLE TROCAR (TROCAR) IMPLANT
TIP RUMI ORANGE 6.7MMX12CM (TIP) IMPLANT
TIP UTERINE 5.1X6CM LAV DISP (MISCELLANEOUS) IMPLANT
TIP UTERINE 6.7X10CM GRN DISP (MISCELLANEOUS) ×4 IMPLANT
TIP UTERINE 6.7X6CM WHT DISP (MISCELLANEOUS) IMPLANT
TIP UTERINE 6.7X8CM BLUE DISP (MISCELLANEOUS) IMPLANT
TOWEL OR 17X24 6PK STRL BLUE (TOWEL DISPOSABLE) ×8 IMPLANT
TRAY FOLEY CATH SILVER 14FR (SET/KITS/TRAYS/PACK) ×4 IMPLANT
TROCAR DILATING TIP 12MM 150MM (ENDOMECHANICALS) ×4 IMPLANT
TROCAR DISP BLADELESS 8 DVNC (TROCAR) ×2 IMPLANT
TROCAR DISP BLADELESS 8MM (TROCAR) ×2
TROCAR PORT AIRSEAL 5X120 (TROCAR) ×4 IMPLANT
TROCAR XCEL 12X100 BLDLESS (ENDOMECHANICALS) ×4 IMPLANT
TROCAR XCEL NON-BLD 5MMX100MML (ENDOMECHANICALS) ×4 IMPLANT
WATER STERILE IRR 1000ML POUR (IV SOLUTION) ×12 IMPLANT

## 2014-08-10 NOTE — Progress Notes (Signed)
There has been no change in the patients history, status or exam since the history and physical.  Filed Vitals:   08/10/14 0625  BP: 113/73  Pulse: 85  Temp: 98.1 F (36.7 C)  TempSrc: Oral  Resp: 18  SpO2: 100%    Lab Results  Component Value Date   WBC 5.7 08/02/2014   HGB 10.8* 08/02/2014   HCT 33.6* 08/02/2014   MCV 83.8 08/02/2014   PLT 389 08/02/2014   UPT neg. Maxxwell Edgett A

## 2014-08-10 NOTE — Anesthesia Postprocedure Evaluation (Signed)
  Anesthesia Post-op Note  Patient: Morgan [name]  Procedure(s) Performed: Procedure(s): ROBOTIC ASSISTED TOTAL HYSTERECTOMY , REMOVAL OF IUD (N/A) BILATERAL SALPINGECTOMY (N/A) CYSTOSCOPY (N/A) Patient is awake and responsive. Pain and nausea are reasonably well controlled. Vital signs are stable and clinically acceptable. Oxygen saturation is clinically acceptable. There are no apparent anesthetic complications at this time. Patient is ready for discharge.

## 2014-08-10 NOTE — Brief Op Note (Addendum)
08/10/2014  9:08 AM  PATIENT:  Morgan [name]  44 y.o. female  PRE-OPERATIVE DIAGNOSIS:  MENORRHAGIA with symptomatic fibroid uterus  POST-OPERATIVE DIAGNOSIS:  same  PROCEDURE:  Procedure(s): ROBOTIC ASSISTED TOTAL HYSTERECTOMY , REMOVAL OF IUD (N/A) BILATERAL SALPINGECTOMY (N/A) CYSTOSCOPY (N/A)  SURGEON:  Surgeon(s) and Role:    * Bobbye Charleston, MD - Primary    * Jerelyn Charles, MD - Assisting  ANESTHESIA:   general  EBL:  Total I/O In: 1000 [I.V.:1000] Out: 180 [Urine:30; Blood:150]   LOCAL MEDICATIONS USED:  OTHER Exparel  SPECIMEN:  Source of Specimen:  uterus, cervix and bilateral tubes  DISPOSITION OF SPECIMEN:  PATHOLOGY  COUNTS:  YES  TOURNIQUET:  * No tourniquets in log *  DICTATION: .Note written in EPIC  PLAN OF CARE: Admit for overnight observation  PATIENT DISPOSITION:  PACU - hemodynamically stable.   Delay start of Pharmacological VTE agent (>24hrs) due to surgical blood loss or risk of bleeding: not applicable  Complications:  None.  Findings:  11 weeks size uterus.  R and L ovaries were normal.  The ureters were identified during multiple points of the case and were always out of the field of dissection.  On cystoscopy, the bladder was intact and bilateral spill was seen from each ureteral orriface.    Medications:  Ancef.  Exparel.  Technique:  After adequate anesthesia was achieved the patient was positioned, prepped and draped in usual sterile fashion.  A speculum was placed in the vagina and the cervix dilated with pratt dilators.  The 10 cm Rumi and 3.5 cm Koh ring were assembled and placed in proper fashion.  The  Speculum was removed and the bladder catheterized with a foley.    Attention was turned to the abdomen where a 1 cm incision was made 1 cm above the umbilicus.  The veress needle was introduced without aspiration of bowel contents or blood and the abdomen insufflated. The long 12 mm trocar was placed and the other three trocar  sites were marked out, all approximately 10 cm from each other and the camera.  Two 8.5 mm trocars were placed on either side of the camera port and a 5 mm assistant port was placed 3 cm above the line between the camera port and the L 8.5 m port.  All trocars were inserted under direct visualization of the camera.  The patient was placed in trendelenburg and then the Robot docked.  The PK forceps were placed on arm 2 and the Hot shears on arm 1 and introduced under direct visualization of the camera.  I then broke scrub and sat down at the console.  The above findings were noted and the ureters identified well out of the field of dissection.  The right fallopian tube was isolated and cauterized with the PK.  The Utero-ovarian ligament was then divided with the PK cautery and shears.  The posterior broad ligament was then divided with the hot shears until the uterosacral ligament. The ureters bilaterally were followed and dissected to where the uterine artery crossed over.  The Broad and cardinal ligaments were then cauterized against the cervix to the level of the Koh ring, securing the uterine artery.  Each pedicle was then incised with the shears.  The anterior leaf was then incised at the reflection of the vessico-uterine junction and the lateral bladder retracted inferiorly after the round ligament had been divided with the PK forceps.  The left tube was cauterized with the PK and divided  with the shears;  then the left utero-ovarian ligament divided with the PK forceps and the scissors.  The round ligament was divided as well and the posterior leaf of the broad ligament then divided with the hot shears. The broad and cardinal ligaments were then cauterized on the left in the same way.   At the level of the internal os, the uterine arteries were bilaterally cauterized with the PK.  The ureters were identified well out of the field of dissection.  .   The bladder was then able to be retracted inferiorly and  the vesico-uterine fascia was incised in the midline until the bladder was removed one cm below the Koh ring.  The hot shears then circumferentially incised the vagina at the level of the reflection on the [facility] ring.  Once the uterus and cervix were amputated, cautery was used to insure hemostasis of the cuff.  Once hemostasis was achieved, the instruments were changed to the long forceps and mega suture cut needle driver and the cuff was closed with a running stitches of 0-vicryl V loc.  The ureters were peristalsing bilaterally well and very lateral to the areas of operation.    The Robot was then undocked and I scrubbed back in.    The skin incisions were closed with subcuticular stitches of 3-0 vicryl Rapide and Dermabond.  All instruments were removed from the vagina and cystoscopy performed, revealing an intact bladder and vigourous spill of urine from each ureteral orifice.  The cystoscope was removed and the patient taken to the recovery room in stable condition.  Ramey Ketcherside A

## 2014-08-10 NOTE — Op Note (Addendum)
08/10/2014  9:08 AM  PATIENT:  Morgan [name]  44 y.o. female  PRE-OPERATIVE DIAGNOSIS:  MENORRHAGIA with symptomatic fibroid uterus  POST-OPERATIVE DIAGNOSIS:  same  PROCEDURE:  Procedure(s): ROBOTIC ASSISTED TOTAL HYSTERECTOMY , REMOVAL OF IUD (N/A) BILATERAL SALPINGECTOMY (N/A) CYSTOSCOPY (N/A)  SURGEON:  Surgeon(s) and Role:    * Bobbye Charleston, MD - Primary    * Jerelyn Charles, MD - Assisting  ANESTHESIA:   general  EBL:  Total I/O In: 1000 [I.V.:1000] Out: 180 [Urine:30; Blood:150]   LOCAL MEDICATIONS USED:  OTHER Exparel  SPECIMEN:  Source of Specimen:  uterus, cervix and bilateral tubes  DISPOSITION OF SPECIMEN:  PATHOLOGY  COUNTS:  YES  TOURNIQUET:  * No tourniquets in log *  DICTATION: .Note written in EPIC  PLAN OF CARE: Admit for overnight observation  PATIENT DISPOSITION:  PACU - hemodynamically stable.   Delay start of Pharmacological VTE agent (>24hrs) due to surgical blood loss or risk of bleeding: not applicable  Complications:  None.  Findings:  11 weeks size uterus.  R and L ovaries were normal.  The ureters were identified during multiple points of the case and were always out of the field of dissection.  On cystoscopy, the bladder was intact and bilateral spill was seen from each ureteral orriface.    Medications:  Ancef.  Exparel.  Technique:  After adequate anesthesia was achieved the patient was positioned, prepped and draped in usual sterile fashion.  A speculum was placed in the vagina and the cervix dilated with pratt dilators.  The Mirena was removed easily and discarded.  The 10 cm Rumi and 3.5 cm Koh ring were assembled and placed in proper fashion.  The  Speculum was removed and the bladder catheterized with a foley.    Attention was turned to the abdomen where a 1 cm incision was made 1 cm above the umbilicus.  The veress needle was introduced without aspiration of bowel contents or blood and the abdomen insufflated. The long 12  mm trocar was placed and the other three trocar sites were marked out, all approximately 10 cm from each other and the camera.  Two 8.5 mm trocars were placed on either side of the camera port and a 5 mm assistant port was placed 3 cm above the line between the camera port and the L 8.5 m port.  All trocars were inserted under direct visualization of the camera.  The patient was placed in trendelenburg and then the Robot docked.  The PK forceps were placed on arm 2 and the Hot shears on arm 1 and introduced under direct visualization of the camera.  I then broke scrub and sat down at the console.  The above findings were noted and the ureters identified well out of the field of dissection.  The right fallopian tube was isolated and cauterized with the PK.  The Utero-ovarian ligament was then divided with the PK cautery and shears.  The posterior broad ligament was then divided with the hot shears until the uterosacral ligament. The ureters bilaterally were followed and dissected to where the uterine artery crossed over.  The Broad and cardinal ligaments were then cauterized against the cervix to the level of the Koh ring, securing the uterine artery.  Each pedicle was then incised with the shears.  The anterior leaf was then incised at the reflection of the vessico-uterine junction and the lateral bladder retracted inferiorly after the round ligament had been divided with the PK forceps.  The left  tube was cauterized with the PK and divided with the shears;  then the left utero-ovarian ligament divided with the PK forceps and the scissors.  The round ligament was divided as well and the posterior leaf of the broad ligament then divided with the hot shears. The broad and cardinal ligaments were then cauterized on the left in the same way.   At the level of the internal os, the uterine arteries were bilaterally cauterized with the PK.  The ureters were identified well out of the field of dissection.  .   The  bladder was then able to be retracted inferiorly and the vesico-uterine fascia was incised in the midline until the bladder was removed one cm below the Koh ring.  The hot shears then circumferentially incised the vagina at the level of the reflection on the [facility] ring.  Once the uterus and cervix were amputated, cautery was used to insure hemostasis of the cuff.  Once hemostasis was achieved, the instruments were changed to the long forceps and mega suture cut needle driver and the cuff was closed with a running stitches of 0-vicryl V loc.  Cautery was used to ensure hemostasis before the stitch and when the insufflation was reduced to 8 mm mercury all pedicles were hemostatic.  The ureters were peristalsing bilaterally well and very lateral to the areas of operation.    The Robot was then undocked and I scrubbed back in.    The skin incisions were closed with subcuticular stitches of 3-0 vicryl Rapide and Dermabond.  All instruments were removed from the vagina and cystoscopy performed, revealing an intact bladder and vigourous spill of urine from each ureteral orifice.  The cystoscope was removed and the patient taken to the recovery room in stable condition.  Maritssa Haughton A

## 2014-08-10 NOTE — Transfer of Care (Signed)
Immediate Anesthesia Transfer of Care Note  Patient: Morgan [name]  Procedure(s) Performed: Procedure(s): ROBOTIC ASSISTED TOTAL HYSTERECTOMY , REMOVAL OF IUD (N/A) BILATERAL SALPINGECTOMY (N/A) CYSTOSCOPY (N/A)  Patient Location: PACU  Anesthesia Type:General  Level of Consciousness: awake, alert  and oriented  Airway & Oxygen Therapy: Patient Spontanous Breathing and Patient connected to nasal cannula oxygen  Post-op Assessment: Report given to RN and Post -op Vital signs reviewed and stable  Post vital signs: Reviewed and stable  Last Vitals:  Filed Vitals:   08/10/14 0625  BP: 113/73  Pulse: 85  Temp: 36.7 C  Resp: 18    Complications: No apparent anesthesia complications

## 2014-08-10 NOTE — Anesthesia Postprocedure Evaluation (Signed)
Anesthesia Post Note  Patient: Morgan [name]  Procedure(s) Performed: Procedure(s) (LRB): ROBOTIC ASSISTED TOTAL HYSTERECTOMY , REMOVAL OF IUD (N/A) BILATERAL SALPINGECTOMY (N/A) CYSTOSCOPY (N/A)  Anesthesia type: General  Patient location: [facility]  Post pain: Pain level controlled  Post assessment: Post-op Vital signs reviewed  Last Vitals:  Filed Vitals:   08/10/14 1601  BP: 101/69  Pulse: 71  Temp: 36.4 C  Resp: 16    Post vital signs: Reviewed  Level of consciousness: sedated  Complications: No apparent anesthesia complications

## 2014-08-10 NOTE — Anesthesia Preprocedure Evaluation (Signed)
Anesthesia Evaluation  Patient identified by MRN, date of birth, ID band Patient awake    Reviewed: Allergy & Precautions, H&P , Patient's Chart, lab work & pertinent test results, reviewed documented beta blocker date and time   Airway Mallampati: II TM Distance: >3 FB Neck ROM: full    Dental no notable dental hx.    Pulmonary former smoker,  breath sounds clear to auscultation  Pulmonary exam normal       Cardiovascular Rhythm:regular Rate:Normal     Neuro/Psych    GI/Hepatic   Endo/Other    Renal/GU      Musculoskeletal   Abdominal   Peds  Hematology   Anesthesia Other Findings   Reproductive/Obstetrics                           Anesthesia Physical Anesthesia Plan  ASA: II  Anesthesia Plan: General   Post-op Pain Management:    Induction: Intravenous  Airway Management Planned: Oral ETT  Additional Equipment:   Intra-op Plan:   Post-operative Plan: Extubation in OR  Informed Consent: I have reviewed the patients History and Physical, chart, labs and discussed the procedure including the risks, benefits and alternatives for the proposed anesthesia with the patient or authorized representative who has indicated his/her understanding and acceptance.   Dental Advisory Given and Dental advisory given  Plan Discussed with: CRNA and Surgeon  Anesthesia Plan Comments: (  Discussed general anesthesia, including possible nausea, instrumentation of airway, sore throat,pulmonary aspiration, etc. I asked if the were any outstanding questions, or  concerns before we proceeded. )        Anesthesia Quick Evaluation  

## 2014-08-10 NOTE — Anesthesia Procedure Notes (Signed)
Procedure Name: Intubation Date/Time: 08/10/2014 7:22 AM Performed by: Jonna Munro Pre-anesthesia Checklist: Patient identified, Emergency Drugs available, Suction available, Patient being monitored and Timeout performed Patient Re-evaluated:Patient Re-evaluated prior to inductionOxygen Delivery Method: Circle system utilized Preoxygenation: Pre-oxygenation with 100% oxygen Intubation Type: IV induction Ventilation: Mask ventilation without difficulty Laryngoscope Size: Mac and 3 Grade View: Grade I Tube type: Oral Tube size: 7.0 mm Number of attempts: 1 Airway Equipment and Method: Stylet Placement Confirmation: ETT inserted through vocal cords under direct vision,  positive ETCO2 and breath sounds checked- equal and bilateral Secured at: 20 cm Tube secured with: Tape Dental Injury: Teeth and Oropharynx as per pre-operative assessment

## 2014-08-10 NOTE — Addendum Note (Signed)
Addendum  created 08/10/14 1831 by Asher Muir, CRNA   Modules edited: Notes Section   Notes Section:  File: [id number]

## 2014-08-11 ENCOUNTER — Encounter (HOSPITAL_COMMUNITY): Payer: Self-pay | Admitting: Obstetrics and Gynecology

## 2014-08-11 DIAGNOSIS — D259 Leiomyoma of uterus, unspecified: Secondary | ICD-10-CM | POA: Diagnosis not present

## 2014-08-11 MED ORDER — OXYCODONE-ACETAMINOPHEN 5-325 MG PO TABS
1.0000 | ORAL_TABLET | ORAL | Status: DC | PRN
Start: 2014-08-11 — End: 2017-01-22

## 2014-08-11 MED ORDER — IBUPROFEN 800 MG PO TABS: 800.0000 mg | ORAL_TABLET | Freq: Three times a day (TID) | ORAL | Status: DC | PRN

## 2014-08-11 NOTE — Progress Notes (Signed)
Pt is discharged in the care of husband,with N.T. Escort. Pt comphrended all instructions well. Questions asked and answered. Abdominal lapsites are all clean and dry Denies any heavy vaginal bleeding. Stable. No equipment needed for home use. Downstairs per ambulatory.

## 2014-08-11 NOTE — Progress Notes (Signed)
Patient is eating, ambulating, and voiding.  Pain control is good.  BP 102/57 mmHg  Pulse 92  Temp(Src) 97.8 F (36.6 C) (Oral)  Resp 16  Ht 5\' 4"  (1.626 m)  Wt 65.772 kg (145 lb)  BMI 24.88 kg/m2  SpO2 97%  lungs:   clear to auscultation cor:    RRR Abdomen:  soft, appropriate tenderness, incisions intact and without erythema or exudate. ex:    no cords   Lab Results  Component Value Date   WBC 5.7 08/02/2014   HGB 10.8* 08/02/2014   HCT 33.6* 08/02/2014   MCV 83.8 08/02/2014   PLT 389 08/02/2014    A/P  Routine care.  Expect d/c per plan.

## 2015-07-06 ENCOUNTER — Other Ambulatory Visit: Payer: Self-pay | Admitting: Obstetrics and Gynecology

## 2015-07-07 LAB — CYTOLOGY - PAP

## 2015-07-19 DIAGNOSIS — F900 Attention-deficit hyperactivity disorder, predominantly inattentive type: Secondary | ICD-10-CM | POA: Diagnosis not present

## 2015-07-19 DIAGNOSIS — F3342 Major depressive disorder, recurrent, in full remission: Secondary | ICD-10-CM | POA: Diagnosis not present

## 2015-08-04 DIAGNOSIS — F339 Major depressive disorder, recurrent, unspecified: Secondary | ICD-10-CM | POA: Diagnosis not present

## 2015-12-27 DIAGNOSIS — F339 Major depressive disorder, recurrent, unspecified: Secondary | ICD-10-CM | POA: Diagnosis not present

## 2016-01-07 DIAGNOSIS — J029 Acute pharyngitis, unspecified: Secondary | ICD-10-CM | POA: Diagnosis not present

## 2016-01-10 DIAGNOSIS — F339 Major depressive disorder, recurrent, unspecified: Secondary | ICD-10-CM | POA: Diagnosis not present

## 2016-01-30 DIAGNOSIS — F900 Attention-deficit hyperactivity disorder, predominantly inattentive type: Secondary | ICD-10-CM | POA: Diagnosis not present

## 2016-01-30 DIAGNOSIS — F3342 Major depressive disorder, recurrent, in full remission: Secondary | ICD-10-CM | POA: Diagnosis not present

## 2016-01-30 DIAGNOSIS — F4321 Adjustment disorder with depressed mood: Secondary | ICD-10-CM | POA: Diagnosis not present

## 2016-02-06 DIAGNOSIS — F4321 Adjustment disorder with depressed mood: Secondary | ICD-10-CM | POA: Diagnosis not present

## 2016-02-13 DIAGNOSIS — D2261 Melanocytic nevi of right upper limb, including shoulder: Secondary | ICD-10-CM | POA: Diagnosis not present

## 2016-02-13 DIAGNOSIS — D225 Melanocytic nevi of trunk: Secondary | ICD-10-CM | POA: Diagnosis not present

## 2016-02-13 DIAGNOSIS — L738 Other specified follicular disorders: Secondary | ICD-10-CM | POA: Diagnosis not present

## 2016-02-13 DIAGNOSIS — D2262 Melanocytic nevi of left upper limb, including shoulder: Secondary | ICD-10-CM | POA: Diagnosis not present

## 2016-03-11 DIAGNOSIS — F900 Attention-deficit hyperactivity disorder, predominantly inattentive type: Secondary | ICD-10-CM | POA: Diagnosis not present

## 2016-03-11 DIAGNOSIS — F331 Major depressive disorder, recurrent, moderate: Secondary | ICD-10-CM | POA: Diagnosis not present

## 2016-03-19 DIAGNOSIS — F4321 Adjustment disorder with depressed mood: Secondary | ICD-10-CM | POA: Diagnosis not present

## 2016-04-22 DIAGNOSIS — F3342 Major depressive disorder, recurrent, in full remission: Secondary | ICD-10-CM | POA: Diagnosis not present

## 2016-04-22 DIAGNOSIS — F900 Attention-deficit hyperactivity disorder, predominantly inattentive type: Secondary | ICD-10-CM | POA: Diagnosis not present

## 2016-05-21 DIAGNOSIS — F339 Major depressive disorder, recurrent, unspecified: Secondary | ICD-10-CM | POA: Diagnosis not present

## 2016-07-24 DIAGNOSIS — F339 Major depressive disorder, recurrent, unspecified: Secondary | ICD-10-CM | POA: Diagnosis not present

## 2016-07-26 DIAGNOSIS — Z01419 Encounter for gynecological examination (general) (routine) without abnormal findings: Secondary | ICD-10-CM | POA: Diagnosis not present

## 2016-07-26 DIAGNOSIS — Z1231 Encounter for screening mammogram for malignant neoplasm of breast: Secondary | ICD-10-CM | POA: Diagnosis not present

## 2016-07-26 DIAGNOSIS — Z6826 Body mass index (BMI) 26.0-26.9, adult: Secondary | ICD-10-CM | POA: Diagnosis not present

## 2016-08-08 DIAGNOSIS — I7300 Raynaud's syndrome without gangrene: Secondary | ICD-10-CM | POA: Diagnosis not present

## 2016-08-08 DIAGNOSIS — R000 Tachycardia, unspecified: Secondary | ICD-10-CM | POA: Diagnosis not present

## 2016-08-08 DIAGNOSIS — F329 Major depressive disorder, single episode, unspecified: Secondary | ICD-10-CM | POA: Diagnosis not present

## 2016-08-08 DIAGNOSIS — Z1389 Encounter for screening for other disorder: Secondary | ICD-10-CM | POA: Diagnosis not present

## 2016-08-08 DIAGNOSIS — Z0000 Encounter for general adult medical examination without abnormal findings: Secondary | ICD-10-CM | POA: Diagnosis not present

## 2016-08-08 DIAGNOSIS — D649 Anemia, unspecified: Secondary | ICD-10-CM | POA: Diagnosis not present

## 2016-08-08 DIAGNOSIS — F900 Attention-deficit hyperactivity disorder, predominantly inattentive type: Secondary | ICD-10-CM | POA: Diagnosis not present

## 2016-08-15 DIAGNOSIS — F339 Major depressive disorder, recurrent, unspecified: Secondary | ICD-10-CM | POA: Diagnosis not present

## 2016-09-05 DIAGNOSIS — N951 Menopausal and female climacteric states: Secondary | ICD-10-CM | POA: Diagnosis not present

## 2016-09-12 DIAGNOSIS — F339 Major depressive disorder, recurrent, unspecified: Secondary | ICD-10-CM | POA: Diagnosis not present

## 2016-10-09 DIAGNOSIS — F339 Major depressive disorder, recurrent, unspecified: Secondary | ICD-10-CM | POA: Diagnosis not present

## 2016-10-23 DIAGNOSIS — N951 Menopausal and female climacteric states: Secondary | ICD-10-CM | POA: Diagnosis not present

## 2016-10-23 DIAGNOSIS — Z6825 Body mass index (BMI) 25.0-25.9, adult: Secondary | ICD-10-CM | POA: Diagnosis not present

## 2016-10-31 DIAGNOSIS — F339 Major depressive disorder, recurrent, unspecified: Secondary | ICD-10-CM | POA: Diagnosis not present

## 2016-11-29 DIAGNOSIS — D649 Anemia, unspecified: Secondary | ICD-10-CM | POA: Diagnosis not present

## 2016-11-29 DIAGNOSIS — R000 Tachycardia, unspecified: Secondary | ICD-10-CM | POA: Diagnosis not present

## 2016-11-29 DIAGNOSIS — G43909 Migraine, unspecified, not intractable, without status migrainosus: Secondary | ICD-10-CM | POA: Diagnosis not present

## 2016-11-29 DIAGNOSIS — I7300 Raynaud's syndrome without gangrene: Secondary | ICD-10-CM | POA: Diagnosis not present

## 2017-01-08 DIAGNOSIS — F900 Attention-deficit hyperactivity disorder, predominantly inattentive type: Secondary | ICD-10-CM | POA: Diagnosis not present

## 2017-01-08 DIAGNOSIS — F3342 Major depressive disorder, recurrent, in full remission: Secondary | ICD-10-CM | POA: Diagnosis not present

## 2017-01-22 ENCOUNTER — Ambulatory Visit (INDEPENDENT_AMBULATORY_CARE_PROVIDER_SITE_OTHER): Payer: BLUE CROSS/BLUE SHIELD | Admitting: Internal Medicine

## 2017-01-22 ENCOUNTER — Encounter: Payer: Self-pay | Admitting: Internal Medicine

## 2017-01-22 VITALS — BP 130/80 | HR 76 | Ht 64.0 in | Wt 147.0 lb

## 2017-01-22 DIAGNOSIS — R112 Nausea with vomiting, unspecified: Secondary | ICD-10-CM | POA: Diagnosis not present

## 2017-01-22 MED ORDER — ESOMEPRAZOLE MAGNESIUM 40 MG PO CPDR: 40.0000 mg | DELAYED_RELEASE_CAPSULE | Freq: Every day | ORAL | 3 refills | Status: DC

## 2017-01-22 NOTE — Patient Instructions (Signed)
You have been scheduled for an endoscopy. Please follow written instructions given to you at your visit today. If you use inhalers (even only as needed), please bring them with you on the day of your procedure. Your physician has requested that you go to www.startemmi.com and enter the access code given to you at your visit today. This web site gives a general overview about your procedure. However, you should still follow specific instructions given to you by our office regarding your preparation for the procedure.  If you are age 13 or older, your body mass index should be between 23-30. Your Body mass index is 25.23 kg/m. If this is out of the aforementioned range listed, please consider follow up with your Primary Care Provider.  We have sent the following medications to your pharmacy for you to pick up at your convenience: Nexium 40 mg daily (increase from previous dosing)  If you are age 65 or older, your body mass index should be between 23-30. Your Body mass index is 25.23 kg/m. If this is out of the aforementioned range listed, please consider follow up with your Primary Care Provider.  If you are age 68 or younger, your body mass index should be between 19-25. Your Body mass index is 25.23 kg/m. If this is out of the aformentioned range listed, please consider follow up with your Primary Care Provider.

## 2017-01-22 NOTE — Progress Notes (Signed)
Patient ID: Morgan [name], female   DOB: December 18, 1970, 47 y.o.   MRN: [id number] HPI:  Morgan [name] is a 46 year old female with a past medical history of ADHD, headaches, anxiety and depression who is seen in consultation at the request of Dr. Forde Dandy to evaluate nausea and vomiting. She is here alone today.  She reports that she has been experiencing daily nausea and intermittent vomiting since late May/early June 2018. She reports similar issues with nausea and vomiting started around May 2017 but did not last as long. Currently she's been experiencing daily nausea worse with eating. Also worse when eating early in the morning. She is having vomiting 1-2 days per week and when at its worst is 3-4 days per week. Nausea is certainly worse in the morning. She is not having heartburn but does have belching in the morning. There's been several times when she has woken in the middle the night and vomited what she ate for dinner. The food appeared undigested. She reports having labs checked with primary care and was told these were unremarkable.  From a medication perspective she was started on guanfacine for ADHD in April 2018. She has been on Adderall and Strattera for years. Trintellix was started in November 2017 as an antidepressant. She has held these medications and moved around the timing of dosing to see if symptoms would improve. She fully stopped her guanfacine for 2 weeks and saw no real change but she started this back recently. She also moved the timing of her Trintellix to bedtime and saw no real change. She was taking medications on an empty stomach and now she is getting up at 5:30 eating small amount of cereal, then taking her medications, then going back to sleep. She reports due to nausea time she is scared to eat. She has lost 4-5 pounds.  She been taking Nexium 20 mg daily and feels that this is helped a little but certainly not fully. She denies dysphagia and odynophagia. Phenergan has not  been helpful. Zofran is very helpful but causes constipation and so she has avoided it. For the most part her bowels have been regular. No blood in her stool or melena. She uses [employer] shakes and one of their products called Isoflush (which is magnesium, peppermint leaf, bentonite and hyssop herb). With this product she has a daily bowel movement without constipation. She also stopped the shakes and saw no change in her nausea or vomiting.  She is a former smoker but none now. She does drink alcohol 1-2 drinks per day. Very rare marijuana which actually helped nausea in the past. She is married with one 18 year old daughter. She works in Engineer, technical sales. Her mother has ulcerative colitis and a history of diverticulitis requiring segmental bowel resection. Her mother has an ostomy.  Past Medical History:  Diagnosis Date  . ADHD (attention deficit hyperactivity disorder)   . Anxiety   . Depression   . Drug overdose 05/26/12, 02/11/13   history x 2 - accidental drug overdose  . Fibroids   . Headache   . Herpes   . Seasonal allergies     Past Surgical History:  Procedure Laterality Date  . BILATERAL SALPINGECTOMY N/A 08/10/2014   Procedure: BILATERAL SALPINGECTOMY;  Surgeon: Bobbye Charleston, MD;  Location: [facility];  Service: Gynecology;  Laterality: N/A;  . CESAREAN SECTION     x 1  . CYSTOSCOPY N/A 08/10/2014   Procedure: CYSTOSCOPY;  Surgeon: Bobbye Charleston, MD;  Location: [facility];  Service: Gynecology;  Laterality: N/A;  . right foot surgery  2007  . ROBOTIC ASSISTED TOTAL HYSTERECTOMY N/A 08/10/2014   Procedure: ROBOTIC ASSISTED TOTAL HYSTERECTOMY , REMOVAL OF IUD;  Surgeon: Bobbye Charleston, MD;  Location: [facility];  Service: Gynecology;  Laterality: N/A;  . TONSILLECTOMY      Outpatient Medications Prior to Visit  Medication Sig Dispense Refill  . ALPRAZolam (XANAX) 0.25 MG tablet Take 0.25 mg by mouth 3 (three) times daily as needed for anxiety.    Marland Kitchen amphetamine-dextroamphetamine (ADDERALL XR)  30 MG 24 hr capsule Take 20 mg by mouth every morning.     Marland Kitchen atomoxetine (STRATTERA) 100 MG capsule Take 100 mg by mouth daily.    Marland Kitchen desloratadine (CLARINEX REDITAB) 2.5 MG disintegrating tablet Take 2.5 mg by mouth every 12 (twelve) hours.    Marland Kitchen ibuprofen (ADVIL,MOTRIN) 200 MG tablet Take 400 mg by mouth every 6 (six) hours as needed for pain (for pain).    . SUMAtriptan (IMITREX) 100 MG tablet Take 100 mg by mouth every 2 (two) hours as needed for migraine. May repeat in 2 hours if headache persists or recurs.    . valACYclovir (VALTREX) 500 MG tablet Take 250-500 mg by mouth See admin instructions. Takes 250 mg daily and takes 500 mg for outbreaks.    Marland Kitchen amphetamine-dextroamphetamine (ADDERALL XR) 10 MG 24 hr capsule Take 10 mg by mouth every morning.    . desvenlafaxine (PRISTIQ) 50 MG 24 hr tablet Take 50 mg by mouth daily.    Marland Kitchen FLUoxetine (PROZAC) 10 MG capsule Take 10 mg by mouth daily as needed (Takes during menstrual cycle).    Marland Kitchen ibuprofen (ADVIL,MOTRIN) 800 MG tablet Take 1 tablet (800 mg total) by mouth every 8 (eight) hours as needed (mild pain). 30 tablet 0  . levonorgestrel (MIRENA) 20 MCG/24HR IUD 1 each by Intrauterine route once.    Marland Kitchen OVER THE COUNTER MEDICATION Take 2 tablets by mouth daily. [place] womens ultra mega green vitamin    . oxyCODONE-acetaminophen (PERCOCET/ROXICET) 5-325 MG per tablet Take 1-2 tablets by mouth every 4 (four) hours as needed for severe pain (moderate to severe pain (when tolerating fluids)). 30 tablet 0   No facility-administered medications prior to visit.     Allergies  Allergen Reactions  . Morphine And Related Itching  . Reglan [Metoclopramide] Nausea And Vomiting and Other (See Comments)    hyperactivity    History reviewed. No pertinent family history.  Social History  Substance Use Topics  . Smoking status: Former Smoker    Packs/day: 0.25    Years: 10.00    Types: Cigarettes    Quit date: 04/16/2003  . Smokeless tobacco: Never Used  .  Alcohol use 8.4 oz/week    14 Glasses of wine per week     Comment: daily wine/beer     ROS: As per history of present illness, otherwise negative  BP 130/80   Pulse 76   Ht 5\' 4"  (1.626 m)   Wt 147 lb (66.7 kg)   BMI 25.23 kg/m  Constitutional: Well-developed and well-nourished. No distress. HEENT: Normocephalic and atraumatic. Oropharynx is clear and moist. Conjunctivae are normal.  No scleral icterus. Neck: Neck supple. Trachea midline. Cardiovascular: Normal rate, regular rhythm and intact distal pulses. No M/R/G Pulmonary/chest: Effort normal and breath sounds normal. No wheezing, rales or rhonchi. Abdominal: Soft, Mild diffuse tenderness throughout without rebound or guarding, nondistended. Bowel sounds active throughout. There are no masses palpable. No hepatosplenomegaly. Extremities: no clubbing, cyanosis, or edema Neurological:  Alert and oriented to person place and time. Skin: Skin is warm and dry.  Psychiatric: Normal mood and affect. Behavior is normal.  Labs reviewed from primary care note dated 01/21/2017 --Hemoglobin 14.7, MCV 91.4, platelet 364, white count 7.44  ASSESSMENT/PLAN: 46 year old female with a past medical history of ADHD, headaches, anxiety and depression who is seen in consultation at the request of Dr. Forde Dandy to evaluate nausea and vomiting.  1. Nausea and vomiting -- long differential but I'm suspicious for medication induced nausea and vomiting. Some of her symptoms also raise the question of idiopathic gastroparesis (she does not have an identifiable risk factor for gastroparesis such as diabetes). I recommended that we start with upper endoscopy to evaluate for structural abnormalities in her upper GI tract and rule out H. pylori. We discussed risks, benefits and alternatives to EGD and she is agreeable and wishes to proceed. I'm going to increase her Nexium to prescription strength, 40 mg daily, to see if this helps further. She can continue Zofran 4  mg every 6 hours as needed for nausea. If she is using this on a regular basis I suggested she try MiraLAX to prevent constipation. --If EGD unremarkable consider gastric empty scan and abdominal ultrasound to exclude gallstones.  2. CRC screening -- screening colonoscopy recommended at age 44     Cc: [facility]. [facility]

## 2017-01-27 ENCOUNTER — Encounter: Payer: Self-pay | Admitting: Internal Medicine

## 2017-02-03 DIAGNOSIS — F339 Major depressive disorder, recurrent, unspecified: Secondary | ICD-10-CM | POA: Diagnosis not present

## 2017-02-04 ENCOUNTER — Encounter: Payer: Self-pay | Admitting: Internal Medicine

## 2017-02-04 ENCOUNTER — Ambulatory Visit (AMBULATORY_SURGERY_CENTER): Payer: BLUE CROSS/BLUE SHIELD | Admitting: Internal Medicine

## 2017-02-04 VITALS — BP 114/81 | HR 72 | Temp 98.6°F | Resp 11 | Ht 64.0 in | Wt 147.0 lb

## 2017-02-04 DIAGNOSIS — R112 Nausea with vomiting, unspecified: Secondary | ICD-10-CM

## 2017-02-04 DIAGNOSIS — K3189 Other diseases of stomach and duodenum: Secondary | ICD-10-CM | POA: Diagnosis not present

## 2017-02-04 MED ORDER — SODIUM CHLORIDE 0.9 % IV SOLN: 500.0000 mL | INTRAVENOUS | Status: DC

## 2017-02-04 NOTE — Progress Notes (Signed)
Pt's states no medical or surgical changes since previsit or office visit. 

## 2017-02-04 NOTE — Op Note (Signed)
[place] Patient Name: Morgan [name] Procedure Date: 02/04/2017 11:00 AM MRN: [id number] Endoscopist: Jerene Bears , MD Age: 46 Referring MD:  Date of Birth: [birth year] Gender: Female Account #: [id number] Procedure:                Upper GI endoscopy Indications:              Nausea with vomiting (patient reports some                            improvement since office visit with higher dose                            Nexium, 40 mg daily) Medicines:                Monitored Anesthesia Care Procedure:                Pre-Anesthesia Assessment:                           - Prior to the procedure, a History and Physical                            was performed, and patient medications and                            allergies were reviewed. The patient's tolerance of                            previous anesthesia was also reviewed. The risks                            and benefits of the procedure and the sedation                            options and risks were discussed with the patient.                            All questions were answered, and informed consent                            was obtained. Prior Anticoagulants: The patient has                            taken no previous anticoagulant or antiplatelet                            agents. ASA Grade Assessment: II - A patient with                            mild systemic disease. After reviewing the risks                            and benefits, the patient was deemed in  satisfactory condition to undergo the procedure.                           After obtaining informed consent, the endoscope was                            passed under direct vision. Throughout the                            procedure, the patient's blood pressure, pulse, and                            oxygen saturations were monitored continuously. The                            Model GIF-[id number]) scope was  introduced                            through the mouth, and advanced to the second part                            of duodenum. The upper GI endoscopy was                            accomplished without difficulty. The patient                            tolerated the procedure well. Scope In: Scope Out: Findings:                 The examined esophagus was normal.                           Scattered moderate inflammation characterized by                            erythema and granularity was found in the cardia,                            in the gastric fundus and in the gastric body.                            Biopsies were taken with a cold forceps for                            histology and Helicobacter pylori testing.                           The examined duodenum was normal. Complications:            No immediate complications. Estimated Blood Loss:     Estimated blood loss was minimal. Impression:               - Normal esophagus.                           -  Gastritis. Biopsied.                           - Normal examined duodenum. Recommendation:           - Patient has a contact number available for                            emergencies. The signs and symptoms of potential                            delayed complications were discussed with the                            patient. Return to normal activities tomorrow.                            Written discharge instructions were provided to the                            patient.                           - Resume previous diet.                           - Continue present medications.                           - Await pathology results. Jerene Bears, MD 02/04/2017 11:25:13 AM This report has been signed electronically.

## 2017-02-04 NOTE — Patient Instructions (Signed)
YOU HAD AN ENDOSCOPIC PROCEDURE TODAY AT [facility]:   Refer to the procedure report that was given to you for any specific questions about what was found during the examination.  If the procedure report does not answer your questions, please call your gastroenterologist to clarify.  If you requested that your care partner not be given the details of your procedure findings, then the procedure report has been included in a sealed envelope for you to review at your convenience later.  YOU SHOULD EXPECT: Some feelings of bloating in the abdomen. Passage of more gas than usual.  Walking can help get rid of the air that was put into your GI tract during the procedure and reduce the bloating. If you had a lower endoscopy (such as a colonoscopy or flexible sigmoidoscopy) you may notice spotting of blood in your stool or on the toilet paper. If you underwent a bowel prep for your procedure, you may not have a normal bowel movement for a few days.  Please Note:  You might notice some irritation and congestion in your nose or some drainage.  This is from the oxygen used during your procedure.  There is no need for concern and it should clear up in a day or so.  SYMPTOMS TO REPORT IMMEDIATELY:   Following lower endoscopy (colonoscopy or flexible sigmoidoscopy):  Excessive amounts of blood in the stool  Significant tenderness or worsening of abdominal pains  Swelling of the abdomen that is new, acute  Fever of 100F or higher   Following upper endoscopy (EGD)  Vomiting of blood or coffee ground material  New chest pain or pain under the shoulder blades  Painful or persistently difficult swallowing  New shortness of breath  Fever of 100F or higher  Black, tarry-looking stools  For urgent or emergent issues, a gastroenterologist can be reached at any hour by calling [phone].   DIET:  We do recommend a small meal at first, but then you may proceed to your regular diet.  Drink  plenty of fluids but you should avoid alcoholic beverages for 24 hours.  ACTIVITY:  You should plan to take it easy for the rest of today and you should NOT DRIVE or use heavy machinery until tomorrow (because of the sedation medicines used during the test).    FOLLOW UP: Our staff will call the number listed on your records the next business day following your procedure to check on you and address any questions or concerns that you may have regarding the information given to you following your procedure. If we do not reach you, we will leave a message.  However, if you are feeling well and you are not experiencing any problems, there is no need to return our call.  We will assume that you have returned to your regular daily activities without incident.  If any biopsies were taken you will be contacted by phone or by letter within the next 1-3 weeks.  Please call us at [phone] if you have not heard about the biopsies in 3 weeks.    SIGNATURES/CONFIDENTIALITY: You and/or your care partner have signed paperwork which will be entered into your electronic medical record.  These signatures attest to the fact that that the information above on your After Visit Summary has been reviewed and is understood.  Full responsibility of the confidentiality of this discharge information lies with you and/or your care-partner.  Gastrititis information given.

## 2017-02-04 NOTE — Progress Notes (Signed)
Called to room to assist during endoscopic procedure.  Patient ID and intended procedure confirmed with present staff. Received instructions for my participation in the procedure from the performing physician.  

## 2017-02-04 NOTE — Progress Notes (Signed)
Report given to PACU, vss 

## 2017-02-05 ENCOUNTER — Telehealth: Payer: Self-pay

## 2017-02-05 NOTE — Telephone Encounter (Signed)
  Follow up Call-  Call Morgan [name] number 02/04/2017  Post procedure Call Morgan [name] phone  # [phone]  Permission to leave phone message Yes  Some recent data might be hidden     Patient questions:  Do you have a fever, pain , or abdominal swelling? No. Pain Score  0 *  Have you tolerated food without any problems? Yes.    Have you been able to return to your normal activities? Yes.    Do you have any questions about your discharge instructions: Diet   No. Medications  No. Follow up visit  No.  Do you have questions or concerns about your Care? No.  Actions: * If pain score is 4 or above: No action needed, pain <4.

## 2017-02-06 ENCOUNTER — Encounter: Payer: Self-pay | Admitting: Internal Medicine

## 2017-02-12 ENCOUNTER — Telehealth: Payer: Self-pay | Admitting: Internal Medicine

## 2017-02-12 NOTE — Telephone Encounter (Signed)
Pt calling wanting to know what the next step will be following her EGD. Please advise.

## 2017-02-13 ENCOUNTER — Other Ambulatory Visit: Payer: Self-pay

## 2017-02-13 DIAGNOSIS — R112 Nausea with vomiting, unspecified: Secondary | ICD-10-CM

## 2017-02-13 DIAGNOSIS — R1011 Right upper quadrant pain: Secondary | ICD-10-CM

## 2017-02-13 NOTE — Telephone Encounter (Signed)
Pt scheduled for GES at [facility] 02/27/17@7am , pt to arrive there at 6:45am. Pt to be NPO after midnight and hold stomach meds prior to test.Pt aware of appt and prep.

## 2017-02-13 NOTE — Telephone Encounter (Signed)
4 hr GES

## 2017-02-27 ENCOUNTER — Encounter (HOSPITAL_COMMUNITY): Payer: BLUE CROSS/BLUE SHIELD

## 2017-02-27 ENCOUNTER — Encounter (HOSPITAL_COMMUNITY)
Admission: RE | Admit: 2017-02-27 | Discharge: 2017-02-27 | Disposition: A | Discharge status: Home / Self Care | Payer: BLUE CROSS/BLUE SHIELD | Source: Ambulatory Visit | Attending: Internal Medicine | Admitting: Internal Medicine

## 2017-02-27 DIAGNOSIS — R634 Abnormal weight loss: Secondary | ICD-10-CM | POA: Diagnosis not present

## 2017-02-27 DIAGNOSIS — R112 Nausea with vomiting, unspecified: Secondary | ICD-10-CM | POA: Diagnosis not present

## 2017-02-27 MED ORDER — TECHNETIUM TC 99M SULFUR COLLOID
2.0000 | Freq: Once | INTRAVENOUS | Status: AC | PRN
  Administered 2017-02-27: 2 via ORAL

## 2017-03-04 ENCOUNTER — Other Ambulatory Visit: Payer: Self-pay

## 2017-03-04 DIAGNOSIS — F339 Major depressive disorder, recurrent, unspecified: Secondary | ICD-10-CM | POA: Diagnosis not present

## 2017-03-04 MED ORDER — SUCRALFATE 1 GM/10ML PO SUSP: 1.0000 g | Freq: Three times a day (TID) | ORAL | 1 refills | Status: DC

## 2017-03-04 MED ORDER — ESOMEPRAZOLE MAGNESIUM 40 MG PO PACK
40.0000 mg | PACK | Freq: Two times a day (BID) | ORAL | 6 refills | Status: DC
Start: 2017-03-04 — End: 2017-04-30

## 2017-03-05 DIAGNOSIS — Z23 Encounter for immunization: Secondary | ICD-10-CM | POA: Diagnosis not present

## 2017-04-22 DIAGNOSIS — L821 Other seborrheic keratosis: Secondary | ICD-10-CM | POA: Diagnosis not present

## 2017-04-22 DIAGNOSIS — D2262 Melanocytic nevi of left upper limb, including shoulder: Secondary | ICD-10-CM | POA: Diagnosis not present

## 2017-04-22 DIAGNOSIS — D2261 Melanocytic nevi of right upper limb, including shoulder: Secondary | ICD-10-CM | POA: Diagnosis not present

## 2017-04-22 DIAGNOSIS — D2239 Melanocytic nevi of other parts of face: Secondary | ICD-10-CM | POA: Diagnosis not present

## 2017-04-29 DIAGNOSIS — F339 Major depressive disorder, recurrent, unspecified: Secondary | ICD-10-CM | POA: Diagnosis not present

## 2017-04-30 ENCOUNTER — Ambulatory Visit: Payer: BLUE CROSS/BLUE SHIELD | Admitting: Nurse Practitioner

## 2017-04-30 ENCOUNTER — Encounter: Payer: Self-pay | Admitting: Nurse Practitioner

## 2017-04-30 VITALS — BP 110/72 | HR 92 | Ht 64.0 in | Wt 139.2 lb

## 2017-04-30 DIAGNOSIS — R112 Nausea with vomiting, unspecified: Secondary | ICD-10-CM | POA: Diagnosis not present

## 2017-04-30 MED ORDER — ONDANSETRON HCL 8 MG PO TABS: 8.0000 mg | ORAL_TABLET | Freq: Three times a day (TID) | ORAL | 2 refills | Status: DC | PRN

## 2017-04-30 MED ORDER — ESOMEPRAZOLE MAGNESIUM 40 MG PO CPDR: 40.0000 mg | DELAYED_RELEASE_CAPSULE | Freq: Two times a day (BID) | ORAL | 3 refills | Status: AC

## 2017-04-30 NOTE — Progress Notes (Addendum)
Chief Complaint:  Nausea / vomiting   HPI: Patient is a 47 year old female known to Dr. Hilarie Fredrickson.  She has ADHD, headaches, anxiety/depression.  Dr. Hilarie Fredrickson saw her October 2018 for evaluation of nausea and vomiting which started 4 months earlier in June. We felt the nausea and vomiting were medication related. She did undergo upper endoscopy which was normal. Following that she had a gastric emptying study which was also normal. Patient is back with ongoing nausea with  vomiting. At various times she has either held or discontinued all of her home medications with the exception of Adderall. She thinks it will be challenging to hold Adderall and perform at work. Uses hormone patch but that was started well after onset of symptoms. She is nauseated on a daily basis, vomits on average a couple of times a week. Last week she vomited 5-6 times but that was atypical. She has no abdominal pain. Her weight is down 8 pounds since late October. She is taking BID PPI. We prescribed Carafate but it seemed to make her nausea worse. No significant bowel changes.   Morgan [name] seems to feel despondent about the ongoing nausea / vomiting. She inquires about any dietary restrictions. Just diagnosed with "mild Raynaulds" and wonders if N/V associated with that. She doesn't take NSAID regularly. We had stopped her nutrient shakes while evaluating N/V and inquires about resuming them. Feels like nutrition is inadequate and she has lost 8 pounds.    Past Medical History:  Diagnosis Date  . ADHD (attention deficit hyperactivity disorder)   . Anxiety   . Depression   . Drug overdose 05/26/12, 02/11/13   history x 2 - accidental drug overdose  . Fibroids   . Headache   . Herpes   . Seasonal allergies     Patient's surgical history, family medical history, social history, medications and allergies were all reviewed in Epic    Physical Exam: BP 110/72   Pulse 92   Ht 5\' 4"  (1.626 m)   Wt 139 lb 3.2 oz (63.1 kg)    LMP 07/26/2014 (Approximate)   BMI 23.89 kg/m    GENERAL:  Well developed white female in NAD PSYCH: :Pleasant, cooperative, normal affect EENT:  conjunctiva pink, mucous membranes moist, neck supple without masses CARDIAC:  RRR, no murmur heard, no peripheral edema PULM: Normal respiratory effort, lungs CTA bilaterally, no wheezing ABDOMEN:  Nondistended, soft, nontender. No obvious masses, no hepatomegaly,  normal bowel sounds SKIN:  turgor, no lesions seen Musculoskeletal:  Normal muscle tone, normal strength NEURO: Alert and oriented x 3, no focal neurologic deficits   ASSESSMENT and PLAN:  Pleasant 47 year old female with nausea and vomiting since June. EGD and gastric emptying study unrevealing. Through the process of elimination she has ruled out all of her medication except Adderall.   -Sees Psychiatry in a month and will discuss holding Adderall for long enough to see if nausea improves. If no improvement then suggest CT scan of abd/pelvis, especially given the 8 pound weight loss.  -Morgan [name] seems to feel despondent about the ongoing nausea / vomiting. She inquires about any dietary restrictions. We had stopped her nutrient shakes while evaluating N/V and inquires about resuming them. Feels like nutrition is inadequate and she has in fact lost 8 pounds. She has been off the shakes without improvement in symptoms so no harm in resuming them at this point.   I spent 25 minutes of face-to-face time with the patient. Greater than 50% of  the time was spent counseling and coordinating care. Questions answered    Morgan [name] , NP 04/30/2017, 10:50 AM  Addendum: Reviewed and agree with  Management. Agree with CT scan abd/pelvis with contrast if no improvement after the above mentioned medication change Pyrtle, Morgan Lines, MD

## 2017-04-30 NOTE — Patient Instructions (Signed)
If you are age 49 or older, your body mass index should be between 23-30. Your Body mass index is 23.89 kg/m. If this is out of the aforementioned range listed, please consider follow up with your Primary Care Provider.  If you are age 55 or younger, your body mass index should be between 19-25. Your Body mass index is 23.89 kg/m. If this is out of the aformentioned range listed, please consider follow up with your Primary Care Provider.   We have sent the following medications to your pharmacy for you to pick up at your convenience: Nexium 40 mg Zofran 8 mg  Please call us after your appointment.  Thank you for choosing me and [facility].   Tye Savoy, NP

## 2017-05-01 ENCOUNTER — Encounter: Payer: Self-pay | Admitting: Nurse Practitioner

## 2017-06-10 DIAGNOSIS — F339 Major depressive disorder, recurrent, unspecified: Secondary | ICD-10-CM | POA: Diagnosis not present

## 2017-06-24 ENCOUNTER — Ambulatory Visit: Payer: BLUE CROSS/BLUE SHIELD | Admitting: Internal Medicine

## 2017-07-07 DIAGNOSIS — F339 Major depressive disorder, recurrent, unspecified: Secondary | ICD-10-CM | POA: Diagnosis not present

## 2017-07-08 DIAGNOSIS — F3342 Major depressive disorder, recurrent, in full remission: Secondary | ICD-10-CM | POA: Diagnosis not present

## 2017-07-08 DIAGNOSIS — F900 Attention-deficit hyperactivity disorder, predominantly inattentive type: Secondary | ICD-10-CM | POA: Diagnosis not present

## 2017-07-21 DIAGNOSIS — R634 Abnormal weight loss: Secondary | ICD-10-CM | POA: Diagnosis not present

## 2017-07-21 DIAGNOSIS — R000 Tachycardia, unspecified: Secondary | ICD-10-CM | POA: Diagnosis not present

## 2017-07-21 DIAGNOSIS — G43909 Migraine, unspecified, not intractable, without status migrainosus: Secondary | ICD-10-CM | POA: Diagnosis not present

## 2017-07-21 DIAGNOSIS — D649 Anemia, unspecified: Secondary | ICD-10-CM | POA: Diagnosis not present

## 2017-07-21 DIAGNOSIS — F3289 Other specified depressive episodes: Secondary | ICD-10-CM | POA: Diagnosis not present

## 2017-07-21 DIAGNOSIS — I7300 Raynaud's syndrome without gangrene: Secondary | ICD-10-CM | POA: Diagnosis not present

## 2017-07-21 DIAGNOSIS — Z1389 Encounter for screening for other disorder: Secondary | ICD-10-CM | POA: Diagnosis not present

## 2017-07-29 DIAGNOSIS — F339 Major depressive disorder, recurrent, unspecified: Secondary | ICD-10-CM | POA: Diagnosis not present

## 2017-08-19 DIAGNOSIS — F339 Major depressive disorder, recurrent, unspecified: Secondary | ICD-10-CM | POA: Diagnosis not present

## 2017-08-22 DIAGNOSIS — Z01419 Encounter for gynecological examination (general) (routine) without abnormal findings: Secondary | ICD-10-CM | POA: Diagnosis not present

## 2017-08-22 DIAGNOSIS — Z1231 Encounter for screening mammogram for malignant neoplasm of breast: Secondary | ICD-10-CM | POA: Diagnosis not present

## 2017-08-22 DIAGNOSIS — Z6823 Body mass index (BMI) 23.0-23.9, adult: Secondary | ICD-10-CM | POA: Diagnosis not present

## 2017-09-17 DIAGNOSIS — F339 Major depressive disorder, recurrent, unspecified: Secondary | ICD-10-CM | POA: Diagnosis not present

## 2017-12-24 DIAGNOSIS — F339 Major depressive disorder, recurrent, unspecified: Secondary | ICD-10-CM | POA: Diagnosis not present

## 2018-01-05 DIAGNOSIS — F339 Major depressive disorder, recurrent, unspecified: Secondary | ICD-10-CM | POA: Diagnosis not present

## 2018-01-06 DIAGNOSIS — F900 Attention-deficit hyperactivity disorder, predominantly inattentive type: Secondary | ICD-10-CM | POA: Diagnosis not present

## 2018-01-06 DIAGNOSIS — F3342 Major depressive disorder, recurrent, in full remission: Secondary | ICD-10-CM | POA: Diagnosis not present

## 2018-01-06 DIAGNOSIS — F4322 Adjustment disorder with anxiety: Secondary | ICD-10-CM | POA: Diagnosis not present

## 2018-01-21 DIAGNOSIS — F900 Attention-deficit hyperactivity disorder, predominantly inattentive type: Secondary | ICD-10-CM | POA: Diagnosis not present

## 2018-01-21 DIAGNOSIS — I7300 Raynaud's syndrome without gangrene: Secondary | ICD-10-CM | POA: Diagnosis not present

## 2018-01-21 DIAGNOSIS — F329 Major depressive disorder, single episode, unspecified: Secondary | ICD-10-CM | POA: Diagnosis not present

## 2018-01-21 DIAGNOSIS — Z23 Encounter for immunization: Secondary | ICD-10-CM | POA: Diagnosis not present

## 2018-01-21 DIAGNOSIS — R634 Abnormal weight loss: Secondary | ICD-10-CM | POA: Diagnosis not present

## 2018-02-03 DIAGNOSIS — F339 Major depressive disorder, recurrent, unspecified: Secondary | ICD-10-CM | POA: Diagnosis not present

## 2018-03-10 DIAGNOSIS — F339 Major depressive disorder, recurrent, unspecified: Secondary | ICD-10-CM | POA: Diagnosis not present

## 2018-03-25 DIAGNOSIS — M2022 Hallux rigidus, left foot: Secondary | ICD-10-CM | POA: Diagnosis not present

## 2018-05-25 DIAGNOSIS — D2261 Melanocytic nevi of right upper limb, including shoulder: Secondary | ICD-10-CM | POA: Diagnosis not present

## 2018-05-25 DIAGNOSIS — D2262 Melanocytic nevi of left upper limb, including shoulder: Secondary | ICD-10-CM | POA: Diagnosis not present

## 2018-05-25 DIAGNOSIS — L738 Other specified follicular disorders: Secondary | ICD-10-CM | POA: Diagnosis not present

## 2018-05-25 DIAGNOSIS — D2239 Melanocytic nevi of other parts of face: Secondary | ICD-10-CM | POA: Diagnosis not present

## 2018-06-03 DIAGNOSIS — F339 Major depressive disorder, recurrent, unspecified: Secondary | ICD-10-CM | POA: Diagnosis not present

## 2018-06-09 DIAGNOSIS — M2022 Hallux rigidus, left foot: Secondary | ICD-10-CM | POA: Diagnosis not present

## 2018-06-09 DIAGNOSIS — G8918 Other acute postprocedural pain: Secondary | ICD-10-CM | POA: Diagnosis not present

## 2018-06-16 DIAGNOSIS — M2022 Hallux rigidus, left foot: Secondary | ICD-10-CM | POA: Diagnosis not present

## 2018-07-22 DIAGNOSIS — R634 Abnormal weight loss: Secondary | ICD-10-CM | POA: Diagnosis not present

## 2018-07-22 DIAGNOSIS — Z0000 Encounter for general adult medical examination without abnormal findings: Secondary | ICD-10-CM | POA: Diagnosis not present

## 2018-07-22 DIAGNOSIS — R7301 Impaired fasting glucose: Secondary | ICD-10-CM | POA: Diagnosis not present

## 2018-07-22 DIAGNOSIS — E559 Vitamin D deficiency, unspecified: Secondary | ICD-10-CM | POA: Diagnosis not present

## 2018-07-23 DIAGNOSIS — R82998 Other abnormal findings in urine: Secondary | ICD-10-CM | POA: Diagnosis not present

## 2018-07-27 DIAGNOSIS — M2022 Hallux rigidus, left foot: Secondary | ICD-10-CM | POA: Diagnosis not present

## 2018-07-28 DIAGNOSIS — F411 Generalized anxiety disorder: Secondary | ICD-10-CM | POA: Diagnosis not present

## 2018-07-28 DIAGNOSIS — F3342 Major depressive disorder, recurrent, in full remission: Secondary | ICD-10-CM | POA: Diagnosis not present

## 2018-07-29 DIAGNOSIS — F339 Major depressive disorder, recurrent, unspecified: Secondary | ICD-10-CM | POA: Diagnosis not present

## 2018-07-30 DIAGNOSIS — Z1331 Encounter for screening for depression: Secondary | ICD-10-CM | POA: Diagnosis not present

## 2018-07-30 DIAGNOSIS — F900 Attention-deficit hyperactivity disorder, predominantly inattentive type: Secondary | ICD-10-CM | POA: Diagnosis not present

## 2018-07-30 DIAGNOSIS — G43909 Migraine, unspecified, not intractable, without status migrainosus: Secondary | ICD-10-CM | POA: Diagnosis not present

## 2018-07-30 DIAGNOSIS — R7301 Impaired fasting glucose: Secondary | ICD-10-CM | POA: Diagnosis not present

## 2018-07-30 DIAGNOSIS — Z0000 Encounter for general adult medical examination without abnormal findings: Secondary | ICD-10-CM | POA: Diagnosis not present

## 2018-07-30 DIAGNOSIS — D649 Anemia, unspecified: Secondary | ICD-10-CM | POA: Diagnosis not present

## 2018-09-15 DIAGNOSIS — M7711 Lateral epicondylitis, right elbow: Secondary | ICD-10-CM | POA: Diagnosis not present

## 2018-09-15 DIAGNOSIS — G5622 Lesion of ulnar nerve, left upper limb: Secondary | ICD-10-CM | POA: Diagnosis not present

## 2018-11-10 DIAGNOSIS — M7711 Lateral epicondylitis, right elbow: Secondary | ICD-10-CM | POA: Diagnosis not present

## 2018-11-10 DIAGNOSIS — G5622 Lesion of ulnar nerve, left upper limb: Secondary | ICD-10-CM | POA: Diagnosis not present

## 2018-11-27 DIAGNOSIS — M6289 Other specified disorders of muscle: Secondary | ICD-10-CM | POA: Diagnosis not present

## 2018-11-27 DIAGNOSIS — M7711 Lateral epicondylitis, right elbow: Secondary | ICD-10-CM | POA: Diagnosis not present

## 2018-11-27 DIAGNOSIS — M62838 Other muscle spasm: Secondary | ICD-10-CM | POA: Diagnosis not present

## 2018-12-02 DIAGNOSIS — M62838 Other muscle spasm: Secondary | ICD-10-CM | POA: Diagnosis not present

## 2018-12-02 DIAGNOSIS — M6289 Other specified disorders of muscle: Secondary | ICD-10-CM | POA: Diagnosis not present

## 2018-12-02 DIAGNOSIS — M7711 Lateral epicondylitis, right elbow: Secondary | ICD-10-CM | POA: Diagnosis not present

## 2018-12-08 DIAGNOSIS — M6289 Other specified disorders of muscle: Secondary | ICD-10-CM | POA: Diagnosis not present

## 2018-12-08 DIAGNOSIS — M62838 Other muscle spasm: Secondary | ICD-10-CM | POA: Diagnosis not present

## 2018-12-08 DIAGNOSIS — M7711 Lateral epicondylitis, right elbow: Secondary | ICD-10-CM | POA: Diagnosis not present

## 2018-12-10 DIAGNOSIS — M6289 Other specified disorders of muscle: Secondary | ICD-10-CM | POA: Diagnosis not present

## 2018-12-10 DIAGNOSIS — M7711 Lateral epicondylitis, right elbow: Secondary | ICD-10-CM | POA: Diagnosis not present

## 2018-12-10 DIAGNOSIS — M62838 Other muscle spasm: Secondary | ICD-10-CM | POA: Diagnosis not present

## 2018-12-15 DIAGNOSIS — M7711 Lateral epicondylitis, right elbow: Secondary | ICD-10-CM | POA: Diagnosis not present

## 2018-12-15 DIAGNOSIS — M62838 Other muscle spasm: Secondary | ICD-10-CM | POA: Diagnosis not present

## 2018-12-15 DIAGNOSIS — M6289 Other specified disorders of muscle: Secondary | ICD-10-CM | POA: Diagnosis not present

## 2018-12-17 DIAGNOSIS — M7711 Lateral epicondylitis, right elbow: Secondary | ICD-10-CM | POA: Diagnosis not present

## 2018-12-17 DIAGNOSIS — M62838 Other muscle spasm: Secondary | ICD-10-CM | POA: Diagnosis not present

## 2018-12-17 DIAGNOSIS — M6289 Other specified disorders of muscle: Secondary | ICD-10-CM | POA: Diagnosis not present

## 2018-12-23 DIAGNOSIS — M6289 Other specified disorders of muscle: Secondary | ICD-10-CM | POA: Diagnosis not present

## 2018-12-23 DIAGNOSIS — M7711 Lateral epicondylitis, right elbow: Secondary | ICD-10-CM | POA: Diagnosis not present

## 2018-12-23 DIAGNOSIS — M62838 Other muscle spasm: Secondary | ICD-10-CM | POA: Diagnosis not present

## 2018-12-30 DIAGNOSIS — M6289 Other specified disorders of muscle: Secondary | ICD-10-CM | POA: Diagnosis not present

## 2018-12-30 DIAGNOSIS — M62838 Other muscle spasm: Secondary | ICD-10-CM | POA: Diagnosis not present

## 2018-12-30 DIAGNOSIS — M7711 Lateral epicondylitis, right elbow: Secondary | ICD-10-CM | POA: Diagnosis not present

## 2019-01-08 DIAGNOSIS — M7711 Lateral epicondylitis, right elbow: Secondary | ICD-10-CM | POA: Diagnosis not present

## 2019-01-08 DIAGNOSIS — M62838 Other muscle spasm: Secondary | ICD-10-CM | POA: Diagnosis not present

## 2019-01-08 DIAGNOSIS — M6289 Other specified disorders of muscle: Secondary | ICD-10-CM | POA: Diagnosis not present

## 2019-01-12 DIAGNOSIS — M7711 Lateral epicondylitis, right elbow: Secondary | ICD-10-CM | POA: Diagnosis not present

## 2019-01-12 DIAGNOSIS — M62838 Other muscle spasm: Secondary | ICD-10-CM | POA: Diagnosis not present

## 2019-01-12 DIAGNOSIS — M6289 Other specified disorders of muscle: Secondary | ICD-10-CM | POA: Diagnosis not present

## 2019-01-14 DIAGNOSIS — M6289 Other specified disorders of muscle: Secondary | ICD-10-CM | POA: Diagnosis not present

## 2019-01-14 DIAGNOSIS — M62838 Other muscle spasm: Secondary | ICD-10-CM | POA: Diagnosis not present

## 2019-01-14 DIAGNOSIS — M7711 Lateral epicondylitis, right elbow: Secondary | ICD-10-CM | POA: Diagnosis not present

## 2019-01-21 DIAGNOSIS — M6289 Other specified disorders of muscle: Secondary | ICD-10-CM | POA: Diagnosis not present

## 2019-01-21 DIAGNOSIS — M7711 Lateral epicondylitis, right elbow: Secondary | ICD-10-CM | POA: Diagnosis not present

## 2019-01-21 DIAGNOSIS — M62838 Other muscle spasm: Secondary | ICD-10-CM | POA: Diagnosis not present

## 2019-01-29 DIAGNOSIS — Z23 Encounter for immunization: Secondary | ICD-10-CM | POA: Diagnosis not present

## 2019-02-02 DIAGNOSIS — F3342 Major depressive disorder, recurrent, in full remission: Secondary | ICD-10-CM | POA: Diagnosis not present

## 2019-02-02 DIAGNOSIS — F900 Attention-deficit hyperactivity disorder, predominantly inattentive type: Secondary | ICD-10-CM | POA: Diagnosis not present

## 2019-04-12 DIAGNOSIS — F339 Major depressive disorder, recurrent, unspecified: Secondary | ICD-10-CM | POA: Diagnosis not present

## 2019-05-03 DIAGNOSIS — Z6824 Body mass index (BMI) 24.0-24.9, adult: Secondary | ICD-10-CM | POA: Diagnosis not present

## 2019-05-03 DIAGNOSIS — Z124 Encounter for screening for malignant neoplasm of cervix: Secondary | ICD-10-CM | POA: Diagnosis not present

## 2019-05-03 DIAGNOSIS — Z1272 Encounter for screening for malignant neoplasm of vagina: Secondary | ICD-10-CM | POA: Diagnosis not present

## 2019-05-03 DIAGNOSIS — Z01419 Encounter for gynecological examination (general) (routine) without abnormal findings: Secondary | ICD-10-CM | POA: Diagnosis not present

## 2019-05-03 DIAGNOSIS — Z1231 Encounter for screening mammogram for malignant neoplasm of breast: Secondary | ICD-10-CM | POA: Diagnosis not present

## 2019-05-11 DIAGNOSIS — F339 Major depressive disorder, recurrent, unspecified: Secondary | ICD-10-CM | POA: Diagnosis not present

## 2019-05-11 DIAGNOSIS — Z20822 Contact with and (suspected) exposure to covid-19: Secondary | ICD-10-CM | POA: Diagnosis not present

## 2019-05-11 DIAGNOSIS — Z8379 Family history of other diseases of the digestive system: Secondary | ICD-10-CM | POA: Diagnosis not present

## 2019-05-11 DIAGNOSIS — Z1211 Encounter for screening for malignant neoplasm of colon: Secondary | ICD-10-CM | POA: Diagnosis not present

## 2019-05-11 DIAGNOSIS — F4323 Adjustment disorder with mixed anxiety and depressed mood: Secondary | ICD-10-CM | POA: Diagnosis not present

## 2019-05-11 DIAGNOSIS — K219 Gastro-esophageal reflux disease without esophagitis: Secondary | ICD-10-CM | POA: Diagnosis not present

## 2019-05-11 DIAGNOSIS — R112 Nausea with vomiting, unspecified: Secondary | ICD-10-CM | POA: Diagnosis not present

## 2019-05-18 ENCOUNTER — Other Ambulatory Visit: Payer: Self-pay | Admitting: Gastroenterology

## 2019-05-18 ENCOUNTER — Other Ambulatory Visit (HOSPITAL_COMMUNITY): Payer: Self-pay | Admitting: Gastroenterology

## 2019-05-18 DIAGNOSIS — R110 Nausea: Secondary | ICD-10-CM

## 2019-05-18 DIAGNOSIS — Z1211 Encounter for screening for malignant neoplasm of colon: Secondary | ICD-10-CM | POA: Diagnosis not present

## 2019-05-18 DIAGNOSIS — R112 Nausea with vomiting, unspecified: Secondary | ICD-10-CM | POA: Diagnosis not present

## 2019-05-18 DIAGNOSIS — Z8379 Family history of other diseases of the digestive system: Secondary | ICD-10-CM | POA: Diagnosis not present

## 2019-05-18 DIAGNOSIS — K219 Gastro-esophageal reflux disease without esophagitis: Secondary | ICD-10-CM | POA: Diagnosis not present

## 2019-05-21 ENCOUNTER — Ambulatory Visit: Payer: BC Managed Care – PPO | Attending: Internal Medicine

## 2019-05-21 DIAGNOSIS — Z20822 Contact with and (suspected) exposure to covid-19: Secondary | ICD-10-CM

## 2019-05-22 LAB — NOVEL CORONAVIRUS, NAA: SARS-CoV-2, NAA: NOT DETECTED

## 2019-05-31 ENCOUNTER — Other Ambulatory Visit: Payer: Self-pay

## 2019-05-31 ENCOUNTER — Ambulatory Visit (HOSPITAL_COMMUNITY): Payer: BC Managed Care – PPO

## 2019-05-31 ENCOUNTER — Ambulatory Visit (HOSPITAL_COMMUNITY)
Admission: RE | Admit: 2019-05-31 | Discharge: 2019-05-31 | Disposition: A | Discharge status: Home / Self Care | Payer: BC Managed Care – PPO | Source: Ambulatory Visit | Attending: Gastroenterology | Admitting: Gastroenterology

## 2019-05-31 DIAGNOSIS — K8020 Calculus of gallbladder without cholecystitis without obstruction: Secondary | ICD-10-CM | POA: Diagnosis not present

## 2019-05-31 DIAGNOSIS — R110 Nausea: Secondary | ICD-10-CM | POA: Diagnosis not present

## 2019-06-02 DIAGNOSIS — M6281 Muscle weakness (generalized): Secondary | ICD-10-CM | POA: Diagnosis not present

## 2019-06-02 DIAGNOSIS — M6289 Other specified disorders of muscle: Secondary | ICD-10-CM | POA: Diagnosis not present

## 2019-06-02 DIAGNOSIS — N393 Stress incontinence (female) (male): Secondary | ICD-10-CM | POA: Diagnosis not present

## 2019-06-08 ENCOUNTER — Encounter (HOSPITAL_COMMUNITY): Payer: BC Managed Care – PPO

## 2019-06-11 DIAGNOSIS — M6281 Muscle weakness (generalized): Secondary | ICD-10-CM | POA: Diagnosis not present

## 2019-06-11 DIAGNOSIS — N393 Stress incontinence (female) (male): Secondary | ICD-10-CM | POA: Diagnosis not present

## 2019-06-11 DIAGNOSIS — M6289 Other specified disorders of muscle: Secondary | ICD-10-CM | POA: Diagnosis not present

## 2019-06-18 DIAGNOSIS — R110 Nausea: Secondary | ICD-10-CM | POA: Diagnosis not present

## 2019-06-18 DIAGNOSIS — K2100 Gastro-esophageal reflux disease with esophagitis, without bleeding: Secondary | ICD-10-CM | POA: Diagnosis not present

## 2019-06-18 DIAGNOSIS — K219 Gastro-esophageal reflux disease without esophagitis: Secondary | ICD-10-CM | POA: Diagnosis not present

## 2019-06-18 DIAGNOSIS — Z1211 Encounter for screening for malignant neoplasm of colon: Secondary | ICD-10-CM | POA: Diagnosis not present

## 2019-07-01 DIAGNOSIS — K219 Gastro-esophageal reflux disease without esophagitis: Secondary | ICD-10-CM | POA: Diagnosis not present

## 2019-07-01 DIAGNOSIS — R110 Nausea: Secondary | ICD-10-CM | POA: Diagnosis not present

## 2019-07-01 DIAGNOSIS — K8080 Other cholelithiasis without obstruction: Secondary | ICD-10-CM | POA: Diagnosis not present

## 2019-07-08 DIAGNOSIS — M6289 Other specified disorders of muscle: Secondary | ICD-10-CM | POA: Diagnosis not present

## 2019-07-08 DIAGNOSIS — N393 Stress incontinence (female) (male): Secondary | ICD-10-CM | POA: Diagnosis not present

## 2019-07-08 DIAGNOSIS — M6281 Muscle weakness (generalized): Secondary | ICD-10-CM | POA: Diagnosis not present

## 2019-07-27 DIAGNOSIS — F411 Generalized anxiety disorder: Secondary | ICD-10-CM | POA: Diagnosis not present

## 2019-07-27 DIAGNOSIS — F3342 Major depressive disorder, recurrent, in full remission: Secondary | ICD-10-CM | POA: Diagnosis not present

## 2019-07-27 DIAGNOSIS — F900 Attention-deficit hyperactivity disorder, predominantly inattentive type: Secondary | ICD-10-CM | POA: Diagnosis not present

## 2019-07-30 DIAGNOSIS — F3342 Major depressive disorder, recurrent, in full remission: Secondary | ICD-10-CM | POA: Diagnosis not present

## 2019-08-06 DIAGNOSIS — F339 Major depressive disorder, recurrent, unspecified: Secondary | ICD-10-CM | POA: Diagnosis not present

## 2019-08-18 DIAGNOSIS — D6489 Other specified anemias: Secondary | ICD-10-CM | POA: Diagnosis not present

## 2019-08-18 DIAGNOSIS — R110 Nausea: Secondary | ICD-10-CM | POA: Diagnosis not present

## 2019-08-18 DIAGNOSIS — R7301 Impaired fasting glucose: Secondary | ICD-10-CM | POA: Diagnosis not present

## 2019-08-18 DIAGNOSIS — R634 Abnormal weight loss: Secondary | ICD-10-CM | POA: Diagnosis not present

## 2019-08-18 DIAGNOSIS — G43909 Migraine, unspecified, not intractable, without status migrainosus: Secondary | ICD-10-CM | POA: Diagnosis not present

## 2019-08-18 DIAGNOSIS — Z1389 Encounter for screening for other disorder: Secondary | ICD-10-CM | POA: Diagnosis not present

## 2019-08-18 DIAGNOSIS — I7300 Raynaud's syndrome without gangrene: Secondary | ICD-10-CM | POA: Diagnosis not present

## 2019-08-18 DIAGNOSIS — F900 Attention-deficit hyperactivity disorder, predominantly inattentive type: Secondary | ICD-10-CM | POA: Diagnosis not present

## 2019-08-18 DIAGNOSIS — D649 Anemia, unspecified: Secondary | ICD-10-CM | POA: Diagnosis not present

## 2019-08-18 DIAGNOSIS — Z79899 Other long term (current) drug therapy: Secondary | ICD-10-CM | POA: Diagnosis not present

## 2019-08-18 DIAGNOSIS — E559 Vitamin D deficiency, unspecified: Secondary | ICD-10-CM | POA: Diagnosis not present

## 2019-08-27 DIAGNOSIS — N393 Stress incontinence (female) (male): Secondary | ICD-10-CM | POA: Diagnosis not present

## 2019-08-27 DIAGNOSIS — M6289 Other specified disorders of muscle: Secondary | ICD-10-CM | POA: Diagnosis not present

## 2019-08-27 DIAGNOSIS — M6281 Muscle weakness (generalized): Secondary | ICD-10-CM | POA: Diagnosis not present

## 2019-08-30 DIAGNOSIS — D225 Melanocytic nevi of trunk: Secondary | ICD-10-CM | POA: Diagnosis not present

## 2019-08-30 DIAGNOSIS — D2262 Melanocytic nevi of left upper limb, including shoulder: Secondary | ICD-10-CM | POA: Diagnosis not present

## 2019-08-30 DIAGNOSIS — D2261 Melanocytic nevi of right upper limb, including shoulder: Secondary | ICD-10-CM | POA: Diagnosis not present

## 2019-08-30 DIAGNOSIS — D2271 Melanocytic nevi of right lower limb, including hip: Secondary | ICD-10-CM | POA: Diagnosis not present

## 2019-11-11 DIAGNOSIS — F339 Major depressive disorder, recurrent, unspecified: Secondary | ICD-10-CM | POA: Diagnosis not present

## 2019-11-25 DIAGNOSIS — F339 Major depressive disorder, recurrent, unspecified: Secondary | ICD-10-CM | POA: Diagnosis not present

## 2019-12-09 DIAGNOSIS — F339 Major depressive disorder, recurrent, unspecified: Secondary | ICD-10-CM | POA: Diagnosis not present

## 2019-12-29 DIAGNOSIS — F339 Major depressive disorder, recurrent, unspecified: Secondary | ICD-10-CM | POA: Diagnosis not present

## 2020-01-18 DIAGNOSIS — F339 Major depressive disorder, recurrent, unspecified: Secondary | ICD-10-CM | POA: Diagnosis not present

## 2020-01-19 DIAGNOSIS — F900 Attention-deficit hyperactivity disorder, predominantly inattentive type: Secondary | ICD-10-CM | POA: Diagnosis not present

## 2020-01-19 DIAGNOSIS — F410 Panic disorder [episodic paroxysmal anxiety] without agoraphobia: Secondary | ICD-10-CM | POA: Diagnosis not present

## 2020-01-19 DIAGNOSIS — F4322 Adjustment disorder with anxiety: Secondary | ICD-10-CM | POA: Diagnosis not present

## 2020-01-19 DIAGNOSIS — F3342 Major depressive disorder, recurrent, in full remission: Secondary | ICD-10-CM | POA: Diagnosis not present

## 2020-02-17 DIAGNOSIS — Z23 Encounter for immunization: Secondary | ICD-10-CM | POA: Diagnosis not present

## 2020-02-17 DIAGNOSIS — D649 Anemia, unspecified: Secondary | ICD-10-CM | POA: Diagnosis not present

## 2020-02-17 DIAGNOSIS — R7301 Impaired fasting glucose: Secondary | ICD-10-CM | POA: Diagnosis not present

## 2020-06-26 DIAGNOSIS — F339 Major depressive disorder, recurrent, unspecified: Secondary | ICD-10-CM | POA: Diagnosis not present

## 2020-06-28 DIAGNOSIS — Z1231 Encounter for screening mammogram for malignant neoplasm of breast: Secondary | ICD-10-CM | POA: Diagnosis not present

## 2020-06-28 DIAGNOSIS — Z01419 Encounter for gynecological examination (general) (routine) without abnormal findings: Secondary | ICD-10-CM | POA: Diagnosis not present

## 2020-07-12 DIAGNOSIS — F3342 Major depressive disorder, recurrent, in full remission: Secondary | ICD-10-CM | POA: Diagnosis not present

## 2020-07-12 DIAGNOSIS — F411 Generalized anxiety disorder: Secondary | ICD-10-CM | POA: Diagnosis not present

## 2020-07-12 DIAGNOSIS — F900 Attention-deficit hyperactivity disorder, predominantly inattentive type: Secondary | ICD-10-CM | POA: Diagnosis not present

## 2020-08-17 DIAGNOSIS — E559 Vitamin D deficiency, unspecified: Secondary | ICD-10-CM | POA: Diagnosis not present

## 2020-08-17 DIAGNOSIS — Z79899 Other long term (current) drug therapy: Secondary | ICD-10-CM | POA: Diagnosis not present

## 2020-08-17 DIAGNOSIS — D649 Anemia, unspecified: Secondary | ICD-10-CM | POA: Diagnosis not present

## 2020-08-17 DIAGNOSIS — I7300 Raynaud's syndrome without gangrene: Secondary | ICD-10-CM | POA: Diagnosis not present

## 2020-08-17 DIAGNOSIS — R7301 Impaired fasting glucose: Secondary | ICD-10-CM | POA: Diagnosis not present

## 2020-09-06 DIAGNOSIS — F339 Major depressive disorder, recurrent, unspecified: Secondary | ICD-10-CM | POA: Diagnosis not present

## 2020-09-12 DIAGNOSIS — L300 Nummular dermatitis: Secondary | ICD-10-CM | POA: Diagnosis not present

## 2020-09-12 DIAGNOSIS — D2261 Melanocytic nevi of right upper limb, including shoulder: Secondary | ICD-10-CM | POA: Diagnosis not present

## 2020-09-12 DIAGNOSIS — D225 Melanocytic nevi of trunk: Secondary | ICD-10-CM | POA: Diagnosis not present

## 2020-09-12 DIAGNOSIS — D2262 Melanocytic nevi of left upper limb, including shoulder: Secondary | ICD-10-CM | POA: Diagnosis not present

## 2020-10-14 ENCOUNTER — Emergency Department (HOSPITAL_COMMUNITY): Payer: BC Managed Care – PPO

## 2020-10-14 ENCOUNTER — Encounter (HOSPITAL_COMMUNITY): Payer: Self-pay | Admitting: Emergency Medicine

## 2020-10-14 ENCOUNTER — Observation Stay (HOSPITAL_COMMUNITY)
Admission: EM | Admit: 2020-10-14 | Discharge: 2020-10-16 | Disposition: A | Discharge status: Home / Self Care | Payer: BC Managed Care – PPO | Attending: Surgery | Admitting: Surgery

## 2020-10-14 ENCOUNTER — Other Ambulatory Visit: Payer: Self-pay

## 2020-10-14 DIAGNOSIS — Z20822 Contact with and (suspected) exposure to covid-19: Secondary | ICD-10-CM | POA: Diagnosis not present

## 2020-10-14 DIAGNOSIS — K828 Other specified diseases of gallbladder: Secondary | ICD-10-CM | POA: Diagnosis not present

## 2020-10-14 DIAGNOSIS — K819 Cholecystitis, unspecified: Secondary | ICD-10-CM | POA: Diagnosis not present

## 2020-10-14 DIAGNOSIS — Z87891 Personal history of nicotine dependence: Secondary | ICD-10-CM | POA: Insufficient documentation

## 2020-10-14 DIAGNOSIS — R1011 Right upper quadrant pain: Secondary | ICD-10-CM | POA: Diagnosis not present

## 2020-10-14 DIAGNOSIS — Z79899 Other long term (current) drug therapy: Secondary | ICD-10-CM | POA: Insufficient documentation

## 2020-10-14 DIAGNOSIS — R102 Pelvic and perineal pain: Secondary | ICD-10-CM | POA: Diagnosis not present

## 2020-10-14 DIAGNOSIS — K8020 Calculus of gallbladder without cholecystitis without obstruction: Secondary | ICD-10-CM | POA: Diagnosis not present

## 2020-10-14 DIAGNOSIS — K8010 Calculus of gallbladder with chronic cholecystitis without obstruction: Secondary | ICD-10-CM | POA: Diagnosis not present

## 2020-10-14 DIAGNOSIS — K8000 Calculus of gallbladder with acute cholecystitis without obstruction: Secondary | ICD-10-CM | POA: Diagnosis present

## 2020-10-14 DIAGNOSIS — M5137 Other intervertebral disc degeneration, lumbosacral region: Secondary | ICD-10-CM | POA: Diagnosis not present

## 2020-10-14 DIAGNOSIS — K8012 Calculus of gallbladder with acute and chronic cholecystitis without obstruction: Secondary | ICD-10-CM | POA: Diagnosis not present

## 2020-10-14 DIAGNOSIS — R079 Chest pain, unspecified: Secondary | ICD-10-CM | POA: Diagnosis not present

## 2020-10-14 DIAGNOSIS — R109 Unspecified abdominal pain: Secondary | ICD-10-CM | POA: Diagnosis not present

## 2020-10-14 DIAGNOSIS — R1031 Right lower quadrant pain: Secondary | ICD-10-CM | POA: Diagnosis not present

## 2020-10-14 LAB — COMPREHENSIVE METABOLIC PANEL
ALT: 14 U/L (ref 0–44)
AST: 18 U/L (ref 15–41)
Albumin: 4 g/dL (ref 3.5–5.0)
Alkaline Phosphatase: 56 U/L (ref 38–126)
Anion gap: 6 (ref 5–15)
BUN: 9 mg/dL (ref 6–20)
CO2: 27 mmol/L (ref 22–32)
Calcium: 9.1 mg/dL (ref 8.9–10.3)
Chloride: 103 mmol/L (ref 98–111)
Creatinine, Ser: 0.85 mg/dL (ref 0.44–1.00)
GFR, Estimated: >60 mL/min (ref >60)
Glucose, Bld: 120 mg/dL — ABNORMAL HIGH (ref 70–99)
Potassium: 4 mmol/L (ref 3.5–5.1)
Sodium: 136 mmol/L (ref 135–145)
Total Bilirubin: 0.6 mg/dL (ref 0.3–1.2)
Total Protein: 7 g/dL (ref 6.5–8.1)

## 2020-10-14 LAB — CBC
HCT: 41.9 % (ref 36.0–46.0)
Hemoglobin: 14.3 g/dL (ref 12.0–15.0)
MCH: 31.6 pg (ref 26.0–34.0)
MCHC: 34.1 g/dL (ref 30.0–36.0)
MCV: 92.5 fL (ref 80.0–100.0)
Platelets: 337 10*3/uL (ref 150–400)
RBC: 4.53 MIL/uL (ref 3.87–5.11)
RDW: 11.5 % (ref 11.5–15.5)
WBC: 5.3 10*3/uL (ref 4.0–10.5)
nRBC: 0 % (ref 0.0–0.2)

## 2020-10-14 LAB — URINALYSIS, ROUTINE W REFLEX MICROSCOPIC
Bilirubin Urine: NEGATIVE
Glucose, UA: NEGATIVE mg/dL
Hgb urine dipstick: NEGATIVE
Ketones, ur: NEGATIVE mg/dL
Leukocytes,Ua: NEGATIVE
Nitrite: NEGATIVE
Protein, ur: NEGATIVE mg/dL
Specific Gravity, Urine: 1.017 (ref 1.005–1.030)
pH: 8 (ref 5.0–8.0)

## 2020-10-14 LAB — I-STAT BETA HCG BLOOD, ED (MC, WL, AP ONLY): I-stat hCG, quantitative: <5 m[IU]/mL (ref <5)

## 2020-10-14 LAB — LIPASE, BLOOD: Lipase: 58 U/L — ABNORMAL HIGH (ref 11–51)

## 2020-10-14 LAB — RESP PANEL BY RT-PCR (FLU A&B, COVID) ARPGX2
Influenza A by PCR: NEGATIVE
Influenza B by PCR: NEGATIVE
SARS Coronavirus 2 by RT PCR: NEGATIVE

## 2020-10-14 LAB — TROPONIN I (HIGH SENSITIVITY)
Troponin I (High Sensitivity): 6 ng/L (ref <18)
Troponin I (High Sensitivity): 5 ng/L (ref <18)

## 2020-10-14 MED ORDER — HYDROMORPHONE HCL 1 MG/ML IJ SOLN
0.5000 mg | Freq: Once | INTRAMUSCULAR | Status: AC
  Administered 2020-10-14: 0.5 mg via INTRAVENOUS
  Filled 2020-10-14: qty 1

## 2020-10-14 MED ORDER — PIPERACILLIN-TAZOBACTAM 3.375 G IVPB 30 MIN
3.3750 g | Freq: Once | INTRAVENOUS | Status: AC
  Administered 2020-10-14: 3.375 g via INTRAVENOUS
  Filled 2020-10-14: qty 50

## 2020-10-14 MED ORDER — ONDANSETRON HCL 4 MG/2ML IJ SOLN: 4.0000 mg | Freq: Four times a day (QID) | INTRAMUSCULAR | Status: DC | PRN

## 2020-10-14 MED ORDER — VORTIOXETINE HBR 20 MG PO TABS
20.0000 mg | ORAL_TABLET | Freq: Every day | ORAL | Status: DC
  Administered 2020-10-15 – 2020-10-16 (×2): 20 mg via ORAL
  Filled 2020-10-14 (×2): qty 1

## 2020-10-14 MED ORDER — ONDANSETRON HCL 4 MG/2ML IJ SOLN
4.0000 mg | Freq: Once | INTRAMUSCULAR | Status: AC
  Administered 2020-10-14: 4 mg via INTRAVENOUS
  Filled 2020-10-14: qty 2

## 2020-10-14 MED ORDER — KETOROLAC TROMETHAMINE 30 MG/ML IJ SOLN
30.0000 mg | Freq: Four times a day (QID) | INTRAMUSCULAR | Status: DC | PRN
  Filled 2020-10-14: qty 1

## 2020-10-14 MED ORDER — FENTANYL CITRATE (PF) 100 MCG/2ML IJ SOLN
50.0000 ug | Freq: Once | INTRAMUSCULAR | Status: AC
  Administered 2020-10-14: 50 ug via INTRAVENOUS
  Filled 2020-10-14: qty 2

## 2020-10-14 MED ORDER — ONDANSETRON HCL 4 MG PO TABS: 4.0000 mg | ORAL_TABLET | Freq: Three times a day (TID) | ORAL | Status: DC | PRN

## 2020-10-14 MED ORDER — ACETAMINOPHEN 650 MG RE SUPP: 650.0000 mg | Freq: Four times a day (QID) | RECTAL | Status: DC | PRN

## 2020-10-14 MED ORDER — IOHEXOL 300 MG/ML  SOLN
100.0000 mL | Freq: Once | INTRAMUSCULAR | Status: AC | PRN
  Administered 2020-10-14: 100 mL via INTRAVENOUS

## 2020-10-14 MED ORDER — PIPERACILLIN-TAZOBACTAM 4.5 G IVPB: 4.5000 g | Freq: Once | INTRAVENOUS | Status: DC

## 2020-10-14 MED ORDER — METHOCARBAMOL 500 MG PO TABS: 500.0000 mg | ORAL_TABLET | Freq: Four times a day (QID) | ORAL | Status: DC | PRN

## 2020-10-14 MED ORDER — SENNA 8.6 MG PO TABS
1.0000 | ORAL_TABLET | Freq: Two times a day (BID) | ORAL | Status: DC
  Administered 2020-10-15 – 2020-10-16 (×3): 8.6 mg via ORAL
  Filled 2020-10-14 (×3): qty 1

## 2020-10-14 MED ORDER — DIPHENHYDRAMINE HCL 50 MG/ML IJ SOLN: 12.5000 mg | Freq: Four times a day (QID) | INTRAMUSCULAR | Status: DC | PRN

## 2020-10-14 MED ORDER — ESTRADIOL 1 MG PO TABS
0.5000 mg | ORAL_TABLET | Freq: Every day | ORAL | Status: DC
  Administered 2020-10-15 – 2020-10-16 (×2): 0.5 mg via ORAL
  Filled 2020-10-14 (×3): qty 0.5

## 2020-10-14 MED ORDER — DIPHENHYDRAMINE HCL 12.5 MG/5ML PO ELIX: 12.5000 mg | ORAL_SOLUTION | Freq: Four times a day (QID) | ORAL | Status: DC | PRN

## 2020-10-14 MED ORDER — KCL-LACTATED RINGERS-D5W 20 MEQ/L IV SOLN
INTRAVENOUS | Status: DC
  Filled 2020-10-14 (×2): qty 1000

## 2020-10-14 MED ORDER — MELATONIN 3 MG PO TABS: 3.0000 mg | ORAL_TABLET | Freq: Every evening | ORAL | Status: DC | PRN

## 2020-10-14 MED ORDER — PANTOPRAZOLE SODIUM 40 MG PO TBEC
40.0000 mg | DELAYED_RELEASE_TABLET | Freq: Every day | ORAL | Status: DC
  Administered 2020-10-15 – 2020-10-16 (×2): 40 mg via ORAL
  Filled 2020-10-14 (×2): qty 1

## 2020-10-14 MED ORDER — HYDROMORPHONE HCL 1 MG/ML IJ SOLN
1.0000 mg | Freq: Once | INTRAMUSCULAR | Status: AC
Start: 2020-10-14 — End: 2020-10-14
  Administered 2020-10-14: 1 mg via INTRAVENOUS
  Filled 2020-10-14: qty 1

## 2020-10-14 MED ORDER — ONDANSETRON 4 MG PO TBDP: 4.0000 mg | ORAL_TABLET | Freq: Four times a day (QID) | ORAL | Status: DC | PRN

## 2020-10-14 MED ORDER — KETOROLAC TROMETHAMINE 30 MG/ML IJ SOLN
30.0000 mg | Freq: Four times a day (QID) | INTRAMUSCULAR | Status: AC
  Administered 2020-10-15 – 2020-10-16 (×4): 30 mg via INTRAVENOUS
  Filled 2020-10-14 (×3): qty 1

## 2020-10-14 MED ORDER — POLYETHYLENE GLYCOL 3350 17 G PO PACK: 17.0000 g | PACK | Freq: Every day | ORAL | Status: DC | PRN

## 2020-10-14 MED ORDER — SODIUM CHLORIDE 0.9 % IV SOLN
2.0000 g | INTRAVENOUS | Status: DC
  Administered 2020-10-15 – 2020-10-16 (×2): 2 g via INTRAVENOUS
  Filled 2020-10-14: qty 2
  Filled 2020-10-14: qty 20
  Filled 2020-10-14: qty 2

## 2020-10-14 MED ORDER — ACETAMINOPHEN 325 MG PO TABS
650.0000 mg | ORAL_TABLET | Freq: Four times a day (QID) | ORAL | Status: DC | PRN
  Administered 2020-10-15: 650 mg via ORAL
  Filled 2020-10-14: qty 2

## 2020-10-14 MED ORDER — ENOXAPARIN SODIUM 40 MG/0.4ML IJ SOSY
40.0000 mg | PREFILLED_SYRINGE | INTRAMUSCULAR | Status: DC
  Administered 2020-10-15: 40 mg via SUBCUTANEOUS
  Filled 2020-10-14: qty 0.4

## 2020-10-14 MED ORDER — VALACYCLOVIR HCL 500 MG PO TABS
250.0000 mg | ORAL_TABLET | Freq: Every day | ORAL | Status: DC
  Administered 2020-10-15 – 2020-10-16 (×2): 250 mg via ORAL
  Filled 2020-10-14 (×2): qty 1

## 2020-10-14 MED ORDER — PROCHLORPERAZINE EDISYLATE 10 MG/2ML IJ SOLN
10.0000 mg | INTRAMUSCULAR | Status: DC | PRN
  Administered 2020-10-14 (×2): 10 mg via INTRAVENOUS
  Filled 2020-10-14 (×3): qty 2

## 2020-10-14 MED ORDER — HYDROMORPHONE HCL 1 MG/ML IJ SOLN
0.5000 mg | Freq: Two times a day (BID) | INTRAMUSCULAR | Status: DC | PRN
  Administered 2020-10-14 – 2020-10-15 (×2): 1 mg via INTRAVENOUS
  Filled 2020-10-14 (×2): qty 1

## 2020-10-14 MED ORDER — AMPHETAMINE-DEXTROAMPHETAMINE 10 MG PO TABS
30.0000 mg | ORAL_TABLET | Freq: Two times a day (BID) | ORAL | Status: DC
  Administered 2020-10-16: 30 mg via ORAL
  Filled 2020-10-14 (×2): qty 3

## 2020-10-14 MED ORDER — LORAZEPAM 0.5 MG PO TABS: 0.5000 mg | ORAL_TABLET | Freq: Every day | ORAL | Status: DC | PRN

## 2020-10-14 MED ORDER — IBUPROFEN 400 MG PO TABS: 400.0000 mg | ORAL_TABLET | Freq: Four times a day (QID) | ORAL | Status: DC | PRN

## 2020-10-14 MED ORDER — GUANFACINE HCL 1 MG PO TABS
2.0000 mg | ORAL_TABLET | Freq: Every day | ORAL | Status: DC
  Administered 2020-10-15 (×2): 2 mg via ORAL
  Filled 2020-10-14 (×3): qty 2

## 2020-10-14 NOTE — ED Triage Notes (Signed)
Pt reports history of gallstones since January 2020.  Reports "heartburn" that started around 9:30am.  Now having severe RUQ pain that radiates into R chest.  Denies nausea and vomiting.

## 2020-10-14 NOTE — ED Notes (Signed)
Pt given ginger ale.

## 2020-10-14 NOTE — ED Provider Notes (Signed)
[facility] EMERGENCY DEPARTMENT Provider Note   CSN: [id number] Arrival date & time: 10/14/20  1120     History Chief Complaint  Patient presents with   Abdominal Pain    Morgan [name] is a 50 y.o. female.  The history is provided by the patient.  Abdominal Pain Pain location:  RUQ Pain quality: burning and stabbing   Pain radiates to:  Chest Pain severity:  Severe Onset quality:  Sudden Duration:  3 hours Timing:  Constant Progression:  Worsening Chronicity:  New Context comment:  Has been diagnosed with gallstones in the past. Still has her gallbladder Relieved by:  Nothing Worsened by:  Movement and palpation Ineffective treatments:  Antacids Associated symptoms: chest pain, chills and nausea   Associated symptoms: no constipation, no cough, no diarrhea, no dysuria, no fever, no hematuria, no shortness of breath, no sore throat and no vomiting       Past Medical History:  Diagnosis Date   ADHD (attention deficit hyperactivity disorder)    Anxiety    Depression    Drug overdose 05/26/12, 02/11/13   history x 2 - accidental drug overdose   Fibroids    Gallstones    Headache    Herpes    Seasonal allergies     Patient Active Problem List   Diagnosis Date Noted   Postoperative state 08/10/2014    Past Surgical History:  Procedure Laterality Date   BILATERAL SALPINGECTOMY N/A 08/10/2014   Procedure: BILATERAL SALPINGECTOMY;  Surgeon: Bobbye Charleston, MD;  Location: [facility];  Service: Gynecology;  Laterality: N/A;   CESAREAN SECTION     x 1   CYSTOSCOPY N/A 08/10/2014   Procedure: CYSTOSCOPY;  Surgeon: Bobbye Charleston, MD;  Location: [facility];  Service: Gynecology;  Laterality: N/A;   right foot surgery  2007   ROBOTIC ASSISTED TOTAL HYSTERECTOMY N/A 08/10/2014   Procedure: ROBOTIC ASSISTED TOTAL HYSTERECTOMY , REMOVAL OF IUD;  Surgeon: Bobbye Charleston, MD;  Location: [facility];  Service: Gynecology;  Laterality: N/A;   TONSILLECTOMY        OB History   No obstetric history on file.     Family History  Problem Relation Age of Onset   Colon cancer Neg Hx    Esophageal cancer Neg Hx    Pancreatic cancer Neg Hx    Stomach cancer Neg Hx    Liver disease Neg Hx     Social History   Tobacco Use   Smoking status: Former    Packs/day: 0.25    Years: 10.00    Pack years: 2.50    Types: Cigarettes    Quit date: 04/16/2003    Years since quitting: 17.5   Smokeless tobacco: Never  Vaping Use   Vaping Use: Never used  Substance Use Topics   Alcohol use: Yes    Alcohol/week: 14.0 standard drinks    Types: 14 Glasses of wine per week    Comment: daily wine/beer-currently holding alcohol for the most part as of 04-30-17   Drug use: Yes    Types: Marijuana    Comment: past    Home Medications Prior to Admission medications   Medication Sig Start Date End Date Taking? Authorizing Provider  ALPRAZolam (XANAX) 0.25 MG tablet Take 0.25 mg by mouth 3 (three) times daily as needed for anxiety.    [provider]  amphetamine-dextroamphetamine (ADDERALL XR) 20 MG 24 hr capsule Take 20 mg by mouth daily.    [provider]  atomoxetine Christianne Borrow)  100 MG capsule Take 100 mg by mouth daily.    [provider]  desloratadine (CLARINEX REDITAB) 2.5 MG disintegrating tablet Take 2.5 mg by mouth every 12 (twelve) hours as needed.     [provider]  esomeprazole (NEXIUM) 40 MG capsule Take 1 capsule (40 mg total) by mouth 2 (two) times daily before a meal. 04/30/17   Willia Craze, NP  estradiol (CLIMARA - DOSED IN MG/24 HR) 0.05 mg/24hr patch Place 0.05 mg onto the skin once a week.    [provider]  guanFACINE (TENEX) 2 MG tablet Take 2 mg by mouth at bedtime.    [provider]  ibuprofen (ADVIL,MOTRIN) 200 MG tablet Take 400 mg by mouth every 6 (six) hours as needed for pain (for pain).    [provider]  ondansetron (ZOFRAN) 8 MG tablet Take 8 mg by mouth  every 8 (eight) hours as needed for nausea or vomiting.    [provider]  ondansetron (ZOFRAN) 8 MG tablet Take 1 tablet (8 mg total) by mouth every 8 (eight) hours as needed for nausea or vomiting. 04/30/17   Willia Craze, NP  OVER THE COUNTER MEDICATION Take 200 mg by mouth at bedtime. Takes 2 at night    [provider]  valACYclovir (VALTREX) 500 MG tablet Take 250-500 mg by mouth See admin instructions. Takes 250 mg daily and takes 500 mg for outbreaks.    [provider]  vortioxetine HBr (TRINTELLIX) 20 MG TABS Take by mouth daily.    [provider]    Allergies    Morphine and related, Nubain [nalbuphine], and Reglan [metoclopramide]  Review of Systems   Review of Systems  Constitutional:  Positive for chills. Negative for fever.  HENT:  Negative for ear pain and sore throat.   Eyes:  Negative for pain and visual disturbance.  Respiratory:  Negative for cough and shortness of breath.   Cardiovascular:  Positive for chest pain. Negative for palpitations.  Gastrointestinal:  Positive for abdominal pain and nausea. Negative for constipation, diarrhea and vomiting.  Genitourinary:  Negative for dysuria and hematuria.  Musculoskeletal:  Negative for arthralgias and back pain.  Skin:  Negative for color change and rash.  Neurological:  Negative for seizures and syncope.  All other systems reviewed and are negative.  Physical Exam Updated Vital Signs BP (!) 176/117 (BP Location: Left Arm)   Pulse 96   Temp 97.9 F (36.6 C) (Oral)   Resp 16   LMP 07/26/2014 (Approximate)   SpO2 100%   Physical Exam Vitals and nursing note reviewed.  Constitutional:      General: She is in acute distress.     Appearance: She is well-developed.     Comments: Appears to be in pain  HENT:     Head: Normocephalic and atraumatic.  Cardiovascular:     Rate and Rhythm: Normal rate and regular rhythm.     Heart sounds: Normal heart sounds.  Pulmonary:      Effort: Pulmonary effort is normal. No tachypnea.     Breath sounds: Normal breath sounds.  Abdominal:     General: Bowel sounds are decreased. There is no distension.     Palpations: Abdomen is soft. There is no hepatomegaly.     Tenderness: There is abdominal tenderness in the right upper quadrant. Positive signs include Murphy's sign.  Musculoskeletal:     Right lower leg: No edema.     Left lower leg: No edema.  Skin:    General: Skin is warm and moist.  Neurological:     General: No focal deficit present.     Mental Status: She is alert and oriented to person, place, and time.  Psychiatric:        Mood and Affect: Mood normal.        Behavior: Behavior normal.    ED Results / Procedures / Treatments   Labs (all labs ordered are listed, but only abnormal results are displayed) Labs Reviewed  LIPASE, BLOOD - Abnormal; Notable for the following components:      Result Value   Lipase 58 (*)    All other components within normal limits  COMPREHENSIVE METABOLIC PANEL - Abnormal; Notable for the following components:   Glucose, Bld 120 (*)    All other components within normal limits  URINALYSIS, ROUTINE W REFLEX MICROSCOPIC - Abnormal; Notable for the following components:   APPearance CLOUDY (*)    All other components within normal limits  RESP PANEL BY RT-PCR (FLU A&B, COVID) ARPGX2  CBC  I-STAT BETA HCG BLOOD, ED ([facility], [facility], [facility] ONLY)  TROPONIN I (HIGH SENSITIVITY)  TROPONIN I (HIGH SENSITIVITY)    EKG EKG Interpretation  Date/Time:  Saturday October 14 2020 11:32:49 EDT Ventricular Rate:  76 PR Interval:    QRS Duration: 76 QT Interval:  376 QTC Calculation: 423 R Axis:   73 Text Interpretation: Likely sinus rhythm but artifact makes interpretation difficult axis normal Nonspecific ST abnormality Abnormal ECG Confirmed by Lorre Munroe ([id number]) on 10/14/2020 12:17:52 PM  Radiology CT Abdomen Pelvis W Contrast  Result Date: 10/14/2020 CLINICAL DATA:  50 year old female  with acute abdominal and pelvic pain today. EXAM: CT ABDOMEN AND PELVIS WITH CONTRAST TECHNIQUE: Multidetector CT imaging of the abdomen and pelvis was performed using the standard protocol following bolus administration of intravenous contrast. CONTRAST:  177mL OMNIPAQUE IOHEXOL 300 MG/ML  SOLN COMPARISON:  Abdominal ultrasound performed today. FINDINGS: Lower chest: No acute abnormality Hepatobiliary: Cholelithiasis identified. There is mild gallbladder wall thickening and possible very mild pericholecystic stranding equivocal for acute cholecystitis. No biliary dilatation. The liver is unremarkable. Pancreas: Unremarkable Spleen: Unremarkable Adrenals/Urinary Tract: The kidneys, adrenal glands and bladder are unremarkable except for a LEFT renal cyst. Stomach/Bowel: Stomach is within normal limits. Appendix appears normal. No evidence of bowel wall thickening, distention, or inflammatory changes. Vascular/Lymphatic: Aortic atherosclerosis. No enlarged abdominal or pelvic lymph nodes. Reproductive: Status post hysterectomy. No adnexal masses. Other: No ascites, pneumoperitoneum or focal collection. Musculoskeletal: No acute or suspicious bony abnormalities are noted. Moderate degenerative disc disease at L5-S1 noted. IMPRESSION: 1. Cholelithiasis with mild gallbladder wall thickening and possible very mild pericholecystic stranding equivocal for acute cholecystitis. No biliary dilatation. 2. Normal appendix. 3. Aortic Atherosclerosis (ICD10-I70.0). Electronically Signed   By: Margarette Canada M.D.   On: 10/14/2020 15:22   DG Chest Port 1 View  Result Date: 10/14/2020 CLINICAL DATA:  UPPER abdominal and chest pain. History of gallstones since January. EXAM: PORTABLE CHEST 1 VIEW COMPARISON:  None. FINDINGS: The heart size and mediastinal contours are within normal limits. Both lungs are clear. The visualized skeletal structures are unremarkable. IMPRESSION: No active disease. Electronically Signed   By: Nolon Nations M.D.   On: 10/14/2020 13:50   US Abdomen Limited RUQ (LIVER/GB)  Result Date: 10/14/2020 CLINICAL DATA:  50 year old female with RIGHT UPPER quadrant abdominal pain today. EXAM: ULTRASOUND ABDOMEN LIMITED RIGHT UPPER QUADRANT COMPARISON:  05/31/2019 FINDINGS: Gallbladder: Cholelithiasis again identified. A 2 cm gallstone  is noted at the gallbladder neck. There is no evidence of gallbladder wall thickening, pericholecystic fluid or sonographic Murphy sign. Common bile duct: Diameter: 3 mm. No intrahepatic or extrahepatic biliary dilatation identified. Liver: No suspicious focal abnormalities noted. Small hyperechoic area within the LEFT liver appears unchanged. Echogenicity is within normal limits. Portal vein is patent on color Doppler imaging with normal direction of blood flow towards the liver. Other: None. IMPRESSION: 1. Cholelithiasis with 2 cm gallstone at the gallbladder neck. No current sonographic evidence of acute cholecystitis. 2. No other significant abnormalities.  No biliary dilatation. Electronically Signed   By: Margarette Canada M.D.   On: 10/14/2020 13:26    Procedures Procedures   Medications Ordered in ED Medications  HYDROmorphone (DILAUDID) injection 0.5-2 mg (has no administration in time range)  prochlorperazine (COMPAZINE) injection 10 mg (10 mg Intravenous Given 10/14/20 1736)  fentaNYL (SUBLIMAZE) injection 50 mcg (50 mcg Intravenous Given 10/14/20 1237)  ondansetron (ZOFRAN) injection 4 mg (4 mg Intravenous Given 10/14/20 1235)  HYDROmorphone (DILAUDID) injection 1 mg (1 mg Intravenous Given 10/14/20 1401)  ondansetron (ZOFRAN) injection 4 mg (4 mg Intravenous Given 10/14/20 1401)  iohexol (OMNIPAQUE) 300 MG/ML solution 100 mL (100 mLs Intravenous Contrast Given 10/14/20 1451)  HYDROmorphone (DILAUDID) injection 0.5 mg (0.5 mg Intravenous Given 10/14/20 1731)  piperacillin-tazobactam (ZOSYN) IVPB 3.375 g (0 g Intravenous Stopped 10/14/20 1833)    ED Course  I have reviewed the  triage vital signs and the nursing notes.  Pertinent labs & imaging results that were available during my care of the patient were reviewed by me and considered in my medical decision making (see chart for details).  Clinical Course as of 10/14/20 2008  Sat Oct 14, 2020  1405 Patient is sweaty, writhing in pain. I don't think her symptoms can be adequately explained by her cholelithiasis. CT will be ordered. [AW]  6010 I spoke with Dr. Barry Dienes of surgery. [AW]    Clinical Course User Index [AW] Arnaldo Natal, MD   MDM Rules/Calculators/A&P                          Baker presented with right upper quadrant pain radiating to her chest.  I was most suspicious for biliary pathology.  Ultrasound revealed cholelithiasis without clear evidence of cholecystitis.  Since the patient was in severe pain, CT scan was obtained to evaluate for other pathology.  At this point, it became clear that her gallbladder was likely the source of her pain.  Surgery was consulted and will take the patient to the OR tomorrow.  No evidence of acute cardiopulmonary pathology in this patient. Final Clinical Impression(s) / ED Diagnoses Final diagnoses:  RUQ abdominal pain  Cholecystitis    Rx / DC Orders ED Discharge Orders     None        Arnaldo Natal, MD 10/14/20 2010

## 2020-10-14 NOTE — Progress Notes (Signed)
Called ER to get report but RN was not available at 2215H.

## 2020-10-14 NOTE — H&P (Signed)
Morgan [name] is an 50 y.o. female.   Chief Complaint: abdominal pain HPI:  Pt is a 50 yo F who came to the ED today with pain starting at 9:30 AM.  The pain came on very suddenly.  It has been centered in her upper abdomen.  She thought it might be acid reflux and took pepcid without any relief.  The pain kept worsening and became quite severe.  She was seen to have gallstone in the gallbladder neck on u/s.  CT was done and was equivocal for acute cholecystitis, but since the patient had continued pain and nausea despite multiple doses of medication, we are asked to see.  She has never had pain like this before.  She denies fever/chills.    Past Medical History:  Diagnosis Date   ADHD (attention deficit hyperactivity disorder)    Anxiety    Depression    Drug overdose 05/26/12, 02/11/13   history x 2 - accidental drug overdose   Fibroids    Gallstones    Headache    Herpes    Seasonal allergies     Past Surgical History:  Procedure Laterality Date   BILATERAL SALPINGECTOMY N/A 08/10/2014   Procedure: BILATERAL SALPINGECTOMY;  Surgeon: Morgan Charleston, MD;  Location: [facility];  Service: Gynecology;  Laterality: N/A;   CESAREAN SECTION     x 1   CYSTOSCOPY N/A 08/10/2014   Procedure: CYSTOSCOPY;  Surgeon: Morgan Charleston, MD;  Location: [facility];  Service: Gynecology;  Laterality: N/A;   right foot surgery  2007   ROBOTIC ASSISTED TOTAL HYSTERECTOMY N/A 08/10/2014   Procedure: ROBOTIC ASSISTED TOTAL HYSTERECTOMY , REMOVAL OF IUD;  Surgeon: Morgan Charleston, MD;  Location: [facility];  Service: Gynecology;  Laterality: N/A;   TONSILLECTOMY      Family History  Problem Relation Age of Onset   Colon cancer Neg Hx    Esophageal cancer Neg Hx    Pancreatic cancer Neg Hx    Stomach cancer Neg Hx    Liver disease Neg Hx    Social History:  reports that she quit smoking about 17 years ago. Her smoking use included cigarettes. She has a 2.50 pack-year smoking history. She has never used  smokeless tobacco. She reports current alcohol use of about 14.0 standard drinks of alcohol per week. She reports current drug use. Drug: Marijuana.  Allergies:  Allergies  Allergen Reactions   Depakote [Divalproex Sodium] Anaphylaxis   Morphine And Related Itching   Nubain [Nalbuphine]    Prochlorperazine    Reglan [Metoclopramide] Nausea And Vomiting and Other (See Comments)    hyperactivity    Current Meds  Medication Sig   amphetamine-dextroamphetamine (ADDERALL) 30 MG tablet Take 30 mg by mouth 2 (two) times daily.   esomeprazole (NEXIUM) 40 MG capsule Take 1 capsule (40 mg total) by mouth 2 (two) times daily before a meal.   estradiol (ESTRACE) 0.5 MG tablet Take 0.5 mg by mouth daily.   LORazepam (ATIVAN) 0.5 MG tablet Take 0.5 mg by mouth daily as needed for anxiety.   ondansetron (ZOFRAN) 4 MG tablet Take 4 mg by mouth every 8 (eight) hours as needed for nausea.   [DISCONTINUED] amphetamine-dextroamphetamine (ADDERALL XR) 20 MG 24 hr capsule Take 20 mg by mouth 2 (two) times daily.     Results for orders placed or performed during the hospital encounter of 10/14/20 (from the past 48 hour(s))  Lipase, blood     Status: Abnormal   Collection Time: 10/14/20 11:30  AM  Result Value Ref Range   Lipase 58 (H) 11 - 51 U/L    Comment: Performed at [facility] [address], [place], [place] [address]  Comprehensive metabolic panel     Status: Abnormal   Collection Time: 10/14/20 11:30 AM  Result Value Ref Range   Sodium 136 135 - 145 mmol/L   Potassium 4.0 3.5 - 5.1 mmol/L   Chloride 103 98 - 111 mmol/L   CO2 27 22 - 32 mmol/L   Glucose, Bld 120 (H) 70 - 99 mg/dL    Comment: Glucose reference range applies only to samples taken after fasting for at least 8 hours.   BUN 9 6 - 20 mg/dL   Creatinine, Ser 0.85 0.44 - 1.00 mg/dL   Calcium 9.1 8.9 - 10.3 mg/dL   Total Protein 7.0 6.5 - 8.1 g/dL   Albumin 4.0 3.5 - 5.0 g/dL   AST 18 15 - 41 U/L   ALT 14 0 - 44 U/L    Alkaline Phosphatase 56 38 - 126 U/L   Total Bilirubin 0.6 0.3 - 1.2 mg/dL   GFR, Estimated >60 >60 mL/min    Comment: (NOTE) Calculated using the CKD-EPI Creatinine Equation (2021)    Anion gap 6 5 - 15    Comment: Performed at [facility] [address] [place], [place] [address]  CBC     Status: None   Collection Time: 10/14/20 11:30 AM  Result Value Ref Range   WBC 5.3 4.0 - 10.5 K/uL   RBC 4.53 3.87 - 5.11 MIL/uL   Hemoglobin 14.3 12.0 - 15.0 g/dL   HCT 41.9 36.0 - 46.0 %   MCV 92.5 80.0 - 100.0 fL   MCH 31.6 26.0 - 34.0 pg   MCHC 34.1 30.0 - 36.0 g/dL   RDW 11.5 11.5 - 15.5 %   Platelets 337 150 - 400 K/uL   nRBC 0.0 0.0 - 0.2 %    Comment: Performed at [facility], [place] [address] [place], [place] [address]  Urinalysis, Routine w reflex microscopic Urine, Clean Catch     Status: Abnormal   Collection Time: 10/14/20 11:31 AM  Result Value Ref Range   Color, Urine YELLOW YELLOW   APPearance CLOUDY (A) CLEAR   Specific Gravity, Urine 1.017 1.005 - 1.030   pH 8.0 5.0 - 8.0   Glucose, UA NEGATIVE NEGATIVE mg/dL   Hgb urine dipstick NEGATIVE NEGATIVE   Bilirubin Urine NEGATIVE NEGATIVE   Ketones, ur NEGATIVE NEGATIVE mg/dL   Protein, ur NEGATIVE NEGATIVE mg/dL   Nitrite NEGATIVE NEGATIVE   Leukocytes,Ua NEGATIVE NEGATIVE    Comment: Performed at [facility] [address] [place], [place] [address]  I-Stat beta hCG blood, ED     Status: None   Collection Time: 10/14/20 11:40 AM  Result Value Ref Range   I-stat hCG, quantitative <5.0 <5 mIU/mL   Comment 3            Comment:   GEST. AGE      CONC.  (mIU/mL)   <=1 WEEK        5 - 50     2 WEEKS       50 - 500     3 WEEKS       100 - 10,000     4 WEEKS     1,000 - 30,000        FEMALE AND NON-PREGNANT FEMALE:  LESS THAN 5 mIU/mL   Troponin I (High Sensitivity)     Status: None   Collection Time: 10/14/20 12:25 PM  Result Value Ref Range   Troponin I (High Sensitivity) 6 <18 ng/L     Comment: (NOTE) Elevated high sensitivity troponin I (hsTnI) values and significant  changes across serial measurements may suggest ACS but many other  chronic and acute conditions are known to elevate hsTnI results.  Refer to the "Links" section for chest pain algorithms and additional  guidance. Performed at [facility], [place] [address], [place], [place] [address]   Troponin I (High Sensitivity)     Status: None   Collection Time: 10/14/20  3:25 PM  Result Value Ref Range   Troponin I (High Sensitivity) 5 <18 ng/L    Comment: (NOTE) Elevated high sensitivity troponin I (hsTnI) values and significant  changes across serial measurements may suggest ACS but many other  chronic and acute conditions are known to elevate hsTnI results.  Refer to the "Links" section for chest pain algorithms and additional  guidance. Performed at [facility], [place] [address] [place], [place] [address]    CT Abdomen Pelvis W Contrast  Result Date: 10/14/2020 CLINICAL DATA:  50 year old female with acute abdominal and pelvic pain today. EXAM: CT ABDOMEN AND PELVIS WITH CONTRAST TECHNIQUE: Multidetector CT imaging of the abdomen and pelvis was performed using the standard protocol following bolus administration of intravenous contrast. CONTRAST:  198mL OMNIPAQUE IOHEXOL 300 MG/ML  SOLN COMPARISON:  Abdominal ultrasound performed today. FINDINGS: Lower chest: No acute abnormality Hepatobiliary: Cholelithiasis identified. There is mild gallbladder wall thickening and possible very mild pericholecystic stranding equivocal for acute cholecystitis. No biliary dilatation. The liver is unremarkable. Pancreas: Unremarkable Spleen: Unremarkable Adrenals/Urinary Tract: The kidneys, adrenal glands and bladder are unremarkable except for a LEFT renal cyst. Stomach/Bowel: Stomach is within normal limits. Appendix appears normal. No evidence of bowel wall thickening, distention, or inflammatory changes.  Vascular/Lymphatic: Aortic atherosclerosis. No enlarged abdominal or pelvic lymph nodes. Reproductive: Status post hysterectomy. No adnexal masses. Other: No ascites, pneumoperitoneum or focal collection. Musculoskeletal: No acute or suspicious bony abnormalities are noted. Moderate degenerative disc disease at L5-S1 noted. IMPRESSION: 1. Cholelithiasis with mild gallbladder wall thickening and possible very mild pericholecystic stranding equivocal for acute cholecystitis. No biliary dilatation. 2. Normal appendix. 3. Aortic Atherosclerosis (ICD10-I70.0). Electronically Signed   By: Margarette Canada M.D.   On: 10/14/2020 15:22   DG Chest Port 1 View  Result Date: 10/14/2020 CLINICAL DATA:  UPPER abdominal and chest pain. History of gallstones since January. EXAM: PORTABLE CHEST 1 VIEW COMPARISON:  None. FINDINGS: The heart size and mediastinal contours are within normal limits. Both lungs are clear. The visualized skeletal structures are unremarkable. IMPRESSION: No active disease. Electronically Signed   By: Nolon Nations M.D.   On: 10/14/2020 13:50   US Abdomen Limited RUQ (LIVER/GB)  Result Date: 10/14/2020 CLINICAL DATA:  50 year old female with RIGHT UPPER quadrant abdominal pain today. EXAM: ULTRASOUND ABDOMEN LIMITED RIGHT UPPER QUADRANT COMPARISON:  05/31/2019 FINDINGS: Gallbladder: Cholelithiasis again identified. A 2 cm gallstone is noted at the gallbladder neck. There is no evidence of gallbladder wall thickening, pericholecystic fluid or sonographic Murphy sign. Common bile duct: Diameter: 3 mm. No intrahepatic or extrahepatic biliary dilatation identified. Liver: No suspicious focal abnormalities noted. Small hyperechoic area within the LEFT liver appears unchanged. Echogenicity is within normal limits. Portal vein is patent on color Doppler imaging with normal direction of blood flow towards  the liver. Other: None. IMPRESSION: 1. Cholelithiasis with 2 cm gallstone at the gallbladder neck. No  current sonographic evidence of acute cholecystitis. 2. No other significant abnormalities.  No biliary dilatation. Electronically Signed   By: Margarette Canada M.D.   On: 10/14/2020 13:26    Review of Systems  Constitutional: Negative.   HENT: Negative.    Eyes: Negative.   Respiratory: Negative.    Cardiovascular: Negative.   Gastrointestinal:  Positive for abdominal distention (MILD) and abdominal pain. Negative for diarrhea.  Endocrine: Negative.   Genitourinary: Negative.   Musculoskeletal: Negative.   Skin: Negative.   Allergic/Immunologic: Negative.   Neurological: Negative.   Hematological: Negative.   Psychiatric/Behavioral: Negative.    All other systems reviewed and are negative.  Blood pressure (!) 187/124, pulse 92, temperature 97.9 F (36.6 C), temperature source Oral, resp. rate (!) 24, last menstrual period 07/26/2014, SpO2 100 %. Physical Exam Constitutional:      General: She is in acute distress.     Appearance: She is well-developed. She is ill-appearing (obviously in pain, dry heaving.).  HENT:     Head: Normocephalic and atraumatic.  Eyes:     General: No scleral icterus.    Extraocular Movements: Extraocular movements intact.     Pupils: Pupils are equal, round, and reactive to light.  Cardiovascular:     Rate and Rhythm: Normal rate and regular rhythm.  Pulmonary:     Effort: Pulmonary effort is normal.     Breath sounds: No stridor.  Abdominal:     General: There is distension (mild).     Palpations: Abdomen is soft. There is no shifting dullness, fluid wave, hepatomegaly, splenomegaly or mass.     Tenderness: There is abdominal tenderness in the right upper quadrant and epigastric area.  Skin:    General: Skin is warm and dry.     Capillary Refill: Capillary refill takes 2 to 3 seconds.     Coloration: Skin is not cyanotic, jaundiced, mottled or pale.     Findings: No erythema or rash.  Neurological:     General: No focal deficit present.     Mental  Status: She is alert.  Psychiatric:        Mood and Affect: Mood normal. Mood is not anxious or depressed.        Behavior: Behavior normal.     Assessment/Plan Acute cholecystitis with stone in gallbladder neck. Admit for observation  Pain control and antiemetics. Will tentatively plan OR tomorrow with Dr. Rosendo Gros.   Discussed surgery with patient and spouse.   Antibiotics  Milus Height, MD FACS Surgical Oncology, General Surgery, Trauma and [facility], [place] for weekday/non holidays Check [email or web address] for coverage night/weekend/holidays  Do not use SecureChat as it is not reliable for timely patient care.

## 2020-10-14 NOTE — ED Notes (Signed)
Patient transported to US 

## 2020-10-14 NOTE — ED Notes (Signed)
Repeat EKG per Dr. Gilford Raid

## 2020-10-14 NOTE — ED Notes (Addendum)
Per [facility] MD [place], pt can have clear liquid until 0000

## 2020-10-15 ENCOUNTER — Observation Stay (HOSPITAL_COMMUNITY): Payer: BC Managed Care – PPO | Admitting: Certified Registered Nurse Anesthetist

## 2020-10-15 ENCOUNTER — Encounter (HOSPITAL_COMMUNITY)
Admission: EM | Disposition: A | Discharge status: Home / Self Care | Payer: Self-pay | Source: Home / Self Care | Attending: Emergency Medicine

## 2020-10-15 ENCOUNTER — Encounter (HOSPITAL_COMMUNITY): Payer: Self-pay

## 2020-10-15 DIAGNOSIS — K8000 Calculus of gallbladder with acute cholecystitis without obstruction: Secondary | ICD-10-CM | POA: Diagnosis not present

## 2020-10-15 DIAGNOSIS — F909 Attention-deficit hyperactivity disorder, unspecified type: Secondary | ICD-10-CM | POA: Diagnosis not present

## 2020-10-15 DIAGNOSIS — K8010 Calculus of gallbladder with chronic cholecystitis without obstruction: Secondary | ICD-10-CM | POA: Diagnosis not present

## 2020-10-15 DIAGNOSIS — J302 Other seasonal allergic rhinitis: Secondary | ICD-10-CM | POA: Diagnosis not present

## 2020-10-15 DIAGNOSIS — D219 Benign neoplasm of connective and other soft tissue, unspecified: Secondary | ICD-10-CM | POA: Diagnosis not present

## 2020-10-15 HISTORY — PX: CHOLECYSTECTOMY: SHX55

## 2020-10-15 LAB — COMPREHENSIVE METABOLIC PANEL
ALT: 15 U/L (ref 0–44)
AST: 23 U/L (ref 15–41)
Albumin: 4.3 g/dL (ref 3.5–5.0)
Alkaline Phosphatase: 65 U/L (ref 38–126)
Anion gap: 12 (ref 5–15)
BUN: 6 mg/dL (ref 6–20)
CO2: 23 mmol/L (ref 22–32)
Calcium: 9.1 mg/dL (ref 8.9–10.3)
Chloride: 98 mmol/L (ref 98–111)
Creatinine, Ser: 0.72 mg/dL (ref 0.44–1.00)
GFR, Estimated: >60 mL/min (ref >60)
Glucose, Bld: 143 mg/dL — ABNORMAL HIGH (ref 70–99)
Potassium: 3.5 mmol/L (ref 3.5–5.1)
Sodium: 133 mmol/L — ABNORMAL LOW (ref 135–145)
Total Bilirubin: 0.8 mg/dL (ref 0.3–1.2)
Total Protein: 7.3 g/dL (ref 6.5–8.1)

## 2020-10-15 LAB — MRSA NEXT GEN BY PCR, NASAL: MRSA by PCR Next Gen: NOT DETECTED

## 2020-10-15 LAB — CBC
HCT: 42.8 % (ref 36.0–46.0)
Hemoglobin: 15.5 g/dL — ABNORMAL HIGH (ref 12.0–15.0)
MCH: 32.2 pg (ref 26.0–34.0)
MCHC: 36.2 g/dL — ABNORMAL HIGH (ref 30.0–36.0)
MCV: 89 fL (ref 80.0–100.0)
Platelets: 294 10*3/uL (ref 150–400)
RBC: 4.81 MIL/uL (ref 3.87–5.11)
RDW: 11.5 % (ref 11.5–15.5)
WBC: 11.3 10*3/uL — ABNORMAL HIGH (ref 4.0–10.5)
nRBC: 0 % (ref 0.0–0.2)

## 2020-10-15 LAB — HIV ANTIBODY (ROUTINE TESTING W REFLEX): HIV Screen 4th Generation wRfx: NONREACTIVE

## 2020-10-15 LAB — CREATININE, SERUM
Creatinine, Ser: 0.74 mg/dL (ref 0.44–1.00)
GFR, Estimated: >60 mL/min (ref >60)

## 2020-10-15 SURGERY — LAPAROSCOPIC CHOLECYSTECTOMY WITH INTRAOPERATIVE CHOLANGIOGRAM
Anesthesia: General | Site: Abdomen

## 2020-10-15 MED ORDER — LIDOCAINE 2% (20 MG/ML) 5 ML SYRINGE
INTRAMUSCULAR | Status: AC
  Filled 2020-10-15: qty 5

## 2020-10-15 MED ORDER — PHENYLEPHRINE 40 MCG/ML (10ML) SYRINGE FOR IV PUSH (FOR BLOOD PRESSURE SUPPORT)
PREFILLED_SYRINGE | INTRAVENOUS | Status: AC
  Filled 2020-10-15: qty 10

## 2020-10-15 MED ORDER — TRAMADOL HCL 50 MG PO TABS
50.0000 mg | ORAL_TABLET | Freq: Four times a day (QID) | ORAL | Status: DC | PRN
  Administered 2020-10-15 – 2020-10-16 (×2): 50 mg via ORAL
  Filled 2020-10-15 (×2): qty 1

## 2020-10-15 MED ORDER — TRAMADOL HCL 50 MG PO TABS: 50.0000 mg | ORAL_TABLET | Freq: Four times a day (QID) | ORAL | 0 refills | Status: AC | PRN

## 2020-10-15 MED ORDER — ROCURONIUM BROMIDE 10 MG/ML (PF) SYRINGE
PREFILLED_SYRINGE | INTRAVENOUS | Status: DC | PRN
  Administered 2020-10-15: 60 mg via INTRAVENOUS

## 2020-10-15 MED ORDER — FENTANYL CITRATE (PF) 250 MCG/5ML IJ SOLN
INTRAMUSCULAR | Status: DC | PRN
  Administered 2020-10-15: 50 ug via INTRAVENOUS
  Administered 2020-10-15 (×2): 100 ug via INTRAVENOUS

## 2020-10-15 MED ORDER — SUCCINYLCHOLINE CHLORIDE 200 MG/10ML IV SOSY
PREFILLED_SYRINGE | INTRAVENOUS | Status: AC
  Filled 2020-10-15: qty 10

## 2020-10-15 MED ORDER — GLYCOPYRROLATE PF 0.2 MG/ML IJ SOSY
PREFILLED_SYRINGE | INTRAMUSCULAR | Status: AC
  Filled 2020-10-15: qty 1

## 2020-10-15 MED ORDER — LIDOCAINE 2% (20 MG/ML) 5 ML SYRINGE
INTRAMUSCULAR | Status: DC | PRN
  Administered 2020-10-15: 60 mg via INTRAVENOUS

## 2020-10-15 MED ORDER — SUGAMMADEX SODIUM 200 MG/2ML IV SOLN
INTRAVENOUS | Status: DC | PRN
  Administered 2020-10-15: 200 mg via INTRAVENOUS

## 2020-10-15 MED ORDER — NEOSTIGMINE METHYLSULFATE 3 MG/3ML IV SOSY
PREFILLED_SYRINGE | INTRAVENOUS | Status: AC
  Filled 2020-10-15: qty 3

## 2020-10-15 MED ORDER — ORAL CARE MOUTH RINSE: 15.0000 mL | Freq: Once | OROMUCOSAL | Status: AC

## 2020-10-15 MED ORDER — CHLORHEXIDINE GLUCONATE 0.12 % MT SOLN: 15.0000 mL | Freq: Once | OROMUCOSAL | Status: AC

## 2020-10-15 MED ORDER — DEXAMETHASONE SODIUM PHOSPHATE 10 MG/ML IJ SOLN
INTRAMUSCULAR | Status: DC | PRN
  Administered 2020-10-15: 5 mg via INTRAVENOUS

## 2020-10-15 MED ORDER — PROPOFOL 10 MG/ML IV BOLUS
INTRAVENOUS | Status: DC | PRN
  Administered 2020-10-15: 130 mg via INTRAVENOUS

## 2020-10-15 MED ORDER — 0.9 % SODIUM CHLORIDE (POUR BTL) OPTIME
TOPICAL | Status: DC | PRN
  Administered 2020-10-15: 1000 mL

## 2020-10-15 MED ORDER — EPHEDRINE 5 MG/ML INJ
INTRAVENOUS | Status: AC
  Filled 2020-10-15: qty 10

## 2020-10-15 MED ORDER — ONDANSETRON HCL 4 MG/2ML IJ SOLN
INTRAMUSCULAR | Status: DC | PRN
  Administered 2020-10-15: 4 mg via INTRAVENOUS

## 2020-10-15 MED ORDER — ARTIFICIAL TEARS OPHTHALMIC OINT
TOPICAL_OINTMENT | OPHTHALMIC | Status: AC
  Filled 2020-10-15: qty 3.5

## 2020-10-15 MED ORDER — ROCURONIUM BROMIDE 10 MG/ML (PF) SYRINGE
PREFILLED_SYRINGE | INTRAVENOUS | Status: AC
  Filled 2020-10-15: qty 10

## 2020-10-15 MED ORDER — DEXTROSE-NACL 5-0.9 % IV SOLN: INTRAVENOUS | Status: DC

## 2020-10-15 MED ORDER — KETOROLAC TROMETHAMINE 30 MG/ML IJ SOLN: 30.0000 mg | Freq: Four times a day (QID) | INTRAMUSCULAR | Status: DC | PRN

## 2020-10-15 MED ORDER — CHLORHEXIDINE GLUCONATE 0.12 % MT SOLN
OROMUCOSAL | Status: AC
  Administered 2020-10-15: 15 mL via OROMUCOSAL
  Filled 2020-10-15: qty 15

## 2020-10-15 MED ORDER — ONDANSETRON HCL 4 MG/2ML IJ SOLN
INTRAMUSCULAR | Status: AC
  Filled 2020-10-15: qty 2

## 2020-10-15 MED ORDER — GLYCOPYRROLATE PF 0.2 MG/ML IJ SOSY
PREFILLED_SYRINGE | INTRAMUSCULAR | Status: DC | PRN
  Administered 2020-10-15: .2 mg via INTRAVENOUS

## 2020-10-15 MED ORDER — ONDANSETRON HCL 4 MG/2ML IJ SOLN
4.0000 mg | Freq: Four times a day (QID) | INTRAMUSCULAR | Status: DC | PRN
Start: 2020-10-15 — End: 2020-10-16
  Filled 2020-10-15: qty 2

## 2020-10-15 MED ORDER — ONDANSETRON 4 MG PO TBDP: 4.0000 mg | ORAL_TABLET | Freq: Four times a day (QID) | ORAL | Status: DC | PRN

## 2020-10-15 MED ORDER — FENTANYL CITRATE (PF) 250 MCG/5ML IJ SOLN
INTRAMUSCULAR | Status: AC
  Filled 2020-10-15: qty 5

## 2020-10-15 MED ORDER — BUPIVACAINE HCL (PF) 0.25 % IJ SOLN
INTRAMUSCULAR | Status: AC
  Filled 2020-10-15: qty 30

## 2020-10-15 MED ORDER — MIDAZOLAM HCL 2 MG/2ML IJ SOLN
INTRAMUSCULAR | Status: DC | PRN
  Administered 2020-10-15: 2 mg via INTRAVENOUS

## 2020-10-15 MED ORDER — PROPOFOL 10 MG/ML IV BOLUS
INTRAVENOUS | Status: AC
  Filled 2020-10-15: qty 20

## 2020-10-15 MED ORDER — MIDAZOLAM HCL 2 MG/2ML IJ SOLN
INTRAMUSCULAR | Status: AC
  Filled 2020-10-15: qty 2

## 2020-10-15 MED ORDER — LACTATED RINGERS IV SOLN: INTRAVENOUS | Status: DC

## 2020-10-15 MED ORDER — SODIUM CHLORIDE 0.9 % IR SOLN
Status: DC | PRN
  Administered 2020-10-15: 1000 mL

## 2020-10-15 MED ORDER — FENTANYL CITRATE (PF) 100 MCG/2ML IJ SOLN: 25.0000 ug | INTRAMUSCULAR | Status: DC | PRN

## 2020-10-15 MED ORDER — PHENYLEPHRINE 40 MCG/ML (10ML) SYRINGE FOR IV PUSH (FOR BLOOD PRESSURE SUPPORT)
PREFILLED_SYRINGE | INTRAVENOUS | Status: DC | PRN
  Administered 2020-10-15 (×2): 80 ug via INTRAVENOUS

## 2020-10-15 MED ORDER — ACETAMINOPHEN 10 MG/ML IV SOLN: 1000.0000 mg | Freq: Once | INTRAVENOUS | Status: DC | PRN

## 2020-10-15 MED ORDER — AMISULPRIDE (ANTIEMETIC) 5 MG/2ML IV SOLN: 10.0000 mg | Freq: Once | INTRAVENOUS | Status: DC | PRN

## 2020-10-15 MED ORDER — BUPIVACAINE HCL 0.25 % IJ SOLN
INTRAMUSCULAR | Status: DC | PRN
  Administered 2020-10-15: 5 mL

## 2020-10-15 MED ORDER — SODIUM CHLORIDE (PF) 0.9 % IJ SOLN
INTRAMUSCULAR | Status: AC
  Filled 2020-10-15: qty 10

## 2020-10-15 SURGICAL SUPPLY — 33 items
BAG COUNTER SPONGE SURGICOUNT (BAG) ×2 IMPLANT
BAG SURGICOUNT SPONGE COUNTING (BAG) ×1
CANISTER SUCT 3000ML PPV (MISCELLANEOUS) ×3 IMPLANT
CHLORAPREP W/TINT 26 (MISCELLANEOUS) ×3 IMPLANT
CLIP VESOLOCK MED LG 6/CT (CLIP) ×3 IMPLANT
COVER SURGICAL LIGHT HANDLE (MISCELLANEOUS) ×3 IMPLANT
DERMABOND ADVANCED (GAUZE/BANDAGES/DRESSINGS) ×2
DERMABOND ADVANCED .7 DNX12 (GAUZE/BANDAGES/DRESSINGS) ×1 IMPLANT
ELECT REM PT RETURN 9FT ADLT (ELECTROSURGICAL) ×3
ELECTRODE REM PT RTRN 9FT ADLT (ELECTROSURGICAL) ×1 IMPLANT
GLOVE SURG ENC MOIS LTX SZ7.5 (GLOVE) ×3 IMPLANT
GOWN STRL REUS W/ TWL LRG LVL3 (GOWN DISPOSABLE) ×2 IMPLANT
GOWN STRL REUS W/ TWL XL LVL3 (GOWN DISPOSABLE) ×1 IMPLANT
GOWN STRL REUS W/TWL LRG LVL3 (GOWN DISPOSABLE) ×6
GOWN STRL REUS W/TWL XL LVL3 (GOWN DISPOSABLE) ×3
GRASPER SUT TROCAR 14GX15 (MISCELLANEOUS) ×3 IMPLANT
KIT BASIN OR (CUSTOM PROCEDURE TRAY) ×3 IMPLANT
KIT TURNOVER KIT B (KITS) ×3 IMPLANT
NEEDLE INSUFFLATION 14GA 120MM (NEEDLE) ×3 IMPLANT
NS IRRIG 1000ML POUR BTL (IV SOLUTION) ×3 IMPLANT
PAD ARMBOARD 7.5X6 YLW CONV (MISCELLANEOUS) ×6 IMPLANT
POUCH RETRIEVAL ECOSAC 10 (ENDOMECHANICALS) ×1 IMPLANT
POUCH RETRIEVAL ECOSAC 10MM (ENDOMECHANICALS) ×3
SCISSORS LAP 5X35 DISP (ENDOMECHANICALS) ×3 IMPLANT
SET IRRIG TUBING LAPAROSCOPIC (IRRIGATION / IRRIGATOR) ×3 IMPLANT
SET TUBE SMOKE EVAC HIGH FLOW (TUBING) ×3 IMPLANT
SLEEVE ENDOPATH XCEL 5M (ENDOMECHANICALS) ×3 IMPLANT
SPECIMEN JAR SMALL (MISCELLANEOUS) ×3 IMPLANT
SUT MNCRL AB 4-0 PS2 18 (SUTURE) ×3 IMPLANT
TRAY LAPAROSCOPIC MC (CUSTOM PROCEDURE TRAY) ×3 IMPLANT
TROCAR XCEL NON-BLD 11X100MML (ENDOMECHANICALS) ×3 IMPLANT
TROCAR XCEL NON-BLD 5MMX100MML (ENDOMECHANICALS) ×3 IMPLANT
WATER STERILE IRR 1000ML POUR (IV SOLUTION) ×3 IMPLANT

## 2020-10-15 NOTE — Discharge Instructions (Signed)
[facility] ______[facility] LAPAROSCOPIC SURGERY: POST OP INSTRUCTIONS Always review your discharge instruction sheet given to you by the facility where your surgery was performed. IF YOU HAVE DISABILITY OR FAMILY LEAVE FORMS, YOU MUST BRING THEM TO THE OFFICE FOR PROCESSING.   DO NOT GIVE THEM TO YOUR DOCTOR.  A prescription for pain medication may be given to you upon discharge.  Take your pain medication as prescribed, if needed.  If narcotic pain medicine is not needed, then you may take acetaminophen (Tylenol) or ibuprofen (Advil) as needed. Take your usually prescribed medications unless otherwise directed. If you need a refill on your pain medication, please contact your pharmacy.  They will contact our office to request authorization. Prescriptions will not be filled after 5pm or on week-ends. You should follow a light diet the first few days after arrival home, such as soup and crackers, etc.  Be sure to include lots of fluids daily. Most patients will experience some swelling and bruising in the area of the incisions.  Ice packs will help.  Swelling and bruising can take several days to resolve.  It is common to experience some constipation if taking pain medication after surgery.  Increasing fluid intake and taking a stool softener (such as Colace) will usually help or prevent this problem from occurring.  A mild laxative (Milk of Magnesia or Miralax) should be taken according to package instructions if there are no bowel movements after 48 hours. Unless discharge instructions indicate otherwise, you may remove your bandages 24-48 hours after surgery, and you may shower at that time.  You may have steri-strips (small skin tapes) in place directly over the incision.  These strips should be left on the skin for 7-10 days.  If your surgeon used skin glue on the incision, you may shower in 24 hours.  The glue will flake off over the next 2-3 weeks.  Any sutures or staples will be  removed at the office during your follow-up visit. ACTIVITIES:  You may resume regular (light) daily activities beginning the next day--such as daily self-care, walking, climbing stairs--gradually increasing activities as tolerated.  You may have sexual intercourse when it is comfortable.  Refrain from any heavy lifting or straining until approved by your doctor. You may drive when you are no longer taking prescription pain medication, you can comfortably wear a seatbelt, and you can safely maneuver your car and apply brakes. RETURN TO WORK:  __________________________________________________________ You should see your doctor in the office for a follow-up appointment approximately 2-3 weeks after your surgery.  Make sure that you call for this appointment within a day or two after you arrive home to insure a convenient appointment time. OTHER INSTRUCTIONS: __________________________________________________________________________________________________________________________ __________________________________________________________________________________________________________________________ WHEN TO CALL YOUR DOCTOR: Fever over 101.0 Inability to urinate Continued bleeding from incision. Increased pain, redness, or drainage from the incision. Increasing abdominal pain  The clinic staff is available to answer your questions during regular business hours.  Please don't hesitate to call and ask to speak to one of the nurses for clinical concerns.  If you have a medical emergency, go to the nearest emergency room or call 911.  A surgeon from [facility] is always on call at the hospital. [address], [address], [place], [place]  [address] ? [address] [address], [place],    [address] [phone] ? [phone] ? FAX [phone] Web site: [email or web address]  

## 2020-10-15 NOTE — Progress Notes (Signed)
Patinet came down from [facility] with undergarments and multiple earrings.  Patient removed all belongings and they were labeled and walked over to PACU, and documented in belonging log

## 2020-10-15 NOTE — Anesthesia Procedure Notes (Signed)
Procedure Name: Intubation Date/Time: 10/15/2020 7:55 AM Performed by: Alain Marion, CRNA Pre-anesthesia Checklist: Patient identified, Emergency Drugs available, Suction available and Patient being monitored Patient Re-evaluated:Patient Re-evaluated prior to induction Oxygen Delivery Method: Circle System Utilized Preoxygenation: Pre-oxygenation with 100% oxygen Induction Type: IV induction Ventilation: Mask ventilation without difficulty Laryngoscope Size: Miller and 2 Grade View: Grade I Tube type: Oral Tube size: 7.0 mm Number of attempts: 1 Airway Equipment and Method: Stylet and Oral airway Placement Confirmation: ETT inserted through vocal cords under direct vision, positive ETCO2 and breath sounds checked- equal and bilateral Secured at: 21 cm Tube secured with: Tape Dental Injury: Teeth and Oropharynx as per pre-operative assessment

## 2020-10-15 NOTE — Anesthesia Preprocedure Evaluation (Addendum)
Anesthesia Evaluation  Patient identified by MRN, date of birth, ID band Patient awake    Reviewed: Allergy & Precautions, NPO status , Patient's Chart, lab work & pertinent test results  Airway Mallampati: II  TM Distance: >3 FB Neck ROM: Full    Dental  (+) Teeth Intact   Pulmonary neg pulmonary ROS, former smoker,    Pulmonary exam normal        Cardiovascular negative cardio ROS   Rhythm:Regular Rate:Normal     Neuro/Psych  Headaches, Anxiety Depression    GI/Hepatic Neg liver ROS, Acute cholecystitis    Endo/Other  negative endocrine ROS  Renal/GU negative Renal ROS  negative genitourinary   Musculoskeletal negative musculoskeletal ROS (+)   Abdominal (+)  Abdomen: tender.    Peds  (+) ADHD Hematology negative hematology ROS (+)   Anesthesia Other Findings   Reproductive/Obstetrics                            Anesthesia Physical Anesthesia Plan  ASA: 2  Anesthesia Plan: General   Post-op Pain Management:    Induction: Intravenous  PONV Risk Score and Plan: 3 and Ondansetron, Dexamethasone, Midazolam and Treatment may vary due to age or medical condition  Airway Management Planned: Mask and Oral ETT  Additional Equipment: None  Intra-op Plan:   Post-operative Plan: Extubation in OR  Informed Consent: I have reviewed the patients History and Physical, chart, labs and discussed the procedure including the risks, benefits and alternatives for the proposed anesthesia with the patient or authorized representative who has indicated his/her understanding and acceptance.     Dental advisory given  Plan Discussed with: CRNA  Anesthesia Plan Comments: (Lab Results      Component                Value               Date                      WBC                      11.3 (H)            10/14/2020                HGB                      15.5 (H)            10/14/2020                 HCT                      42.8                10/14/2020                MCV                      89.0                10/14/2020                PLT                      294  10/14/2020           Lab Results      Component                Value               Date                      NA                       133 (L)             10/15/2020                K                        3.5                 10/15/2020                CO2                      23                  10/15/2020                GLUCOSE                  143 (H)             10/15/2020                BUN                      6                   10/15/2020                CREATININE               0.72                10/15/2020                CALCIUM                  9.1                 10/15/2020                GFRNONAA                 >60                 10/15/2020                GFRAA                    >90                 02/11/2013          )        Anesthesia Quick Evaluation

## 2020-10-15 NOTE — Transfer of Care (Signed)
Immediate Anesthesia Transfer of Care Note  Patient: Morgan [name]  Procedure(s) Performed: LAPAROSCOPIC CHOLECYSTECTOMY (Abdomen)  Patient Location: PACU  Anesthesia Type:General  Level of Consciousness: awake, alert  and oriented  Airway & Oxygen Therapy: Patient Spontanous Breathing and Patient connected to face mask oxygen  Post-op Assessment: Report given to RN and Post -op Vital signs reviewed and stable  Post vital signs: Reviewed and stable  Last Vitals:  Vitals Value Taken Time  BP 133/80 10/15/20 0848  Temp 36.3 C 10/15/20 0848  Pulse 82 10/15/20 0855  Resp 24 10/15/20 0855  SpO2 97 % 10/15/20 0855  Vitals shown include unvalidated device data.  Last Pain:  Vitals:   10/15/20 0848  TempSrc:   PainSc: 0-No pain      Patients Stated Pain Goal: 2 (30/86/57 8469)  Complications: No notable events documented.

## 2020-10-15 NOTE — Progress Notes (Addendum)
Left the unit via bed. Put the rings in the pocket of patient's pants per her request. Witnessed by Zella Ball, Therapist, sports. Will hand over to the day RN.   0710H, rings, cell phone and clothes given to husband.

## 2020-10-15 NOTE — Plan of Care (Signed)
  Problem: Clinical Measurements: Goal: Ability to maintain clinical measurements within normal limits will improve Outcome: Progressing Goal: Will remain free from infection Outcome: Progressing Goal: Diagnostic test results will improve Outcome: Progressing Goal: Respiratory complications will improve Outcome: Progressing Goal: Cardiovascular complication will be avoided Outcome: Progressing   Problem: Health Behavior/Discharge Planning: Goal: Ability to manage health-related needs will improve Outcome: Progressing   Problem: Elimination: Goal: Will not experience complications related to bowel motility Outcome: Progressing   Problem: Pain Managment: Goal: General experience of comfort will improve Outcome: Progressing   Problem: Safety: Goal: Ability to remain free from injury will improve Outcome: Progressing   Problem: Skin Integrity: Goal: Risk for impaired skin integrity will decrease Outcome: Progressing

## 2020-10-15 NOTE — Anesthesia Postprocedure Evaluation (Signed)
Anesthesia Post Note  Patient: Morgan [name]  Procedure(s) Performed: LAPAROSCOPIC CHOLECYSTECTOMY (Abdomen)     Patient location during evaluation: PACU Anesthesia Type: General Level of consciousness: awake and alert Pain management: pain level controlled Vital Signs Assessment: post-procedure vital signs reviewed and stable Respiratory status: spontaneous breathing, nonlabored ventilation, respiratory function stable and patient connected to nasal cannula oxygen Cardiovascular status: blood pressure returned to baseline and stable Postop Assessment: no apparent nausea or vomiting Anesthetic complications: no   No notable events documented.  Last Vitals:  Vitals:   10/15/20 0918 10/15/20 0936  BP: (!) 161/87 (!) 160/89  Pulse: 77 70  Resp: (!) 21 20  Temp: (!) 36.3 C 36.4 C  SpO2: 97% 98%    Last Pain:  Vitals:   10/15/20 1525  TempSrc:   PainSc: 4                  Jazmina Muhlenkamp P Timber Lucarelli

## 2020-10-15 NOTE — Op Note (Signed)
/  06/2020  8:33 AM  PATIENT:  Morgan [name]  50 y.o. female  PRE-OPERATIVE DIAGNOSIS:  cholecystitis, cholelithiasis  POST-OPERATIVE DIAGNOSIS:  cholecystitis, cholelithiasis  PROCEDURE:  Procedure(s): LAPAROSCOPIC CHOLECYSTECTOMY (N/A)  SURGEON:  Surgeon(s) and Role:    * Ralene Ok, MD - Primary  PHYSICIAN ASSISTANT:   ASSISTANTS: none   ANESTHESIA:   local and general  EBL:  minimal   BLOOD ADMINISTERED:none  DRAINS: none   LOCAL MEDICATIONS USED:  BUPIVICAINE   SPECIMEN:  Source of Specimen: Gallbladder  DISPOSITION OF SPECIMEN:  PATHOLOGY  COUNTS:  YES  TOURNIQUET:  * No tourniquets in log *  DICTATION: .Dragon Dictation Indication procedure: Patient is a 50 year old female with epigastric abdominal pain.  Patient underwent work-up for her abdominal pain.  Patient was found to have acute cholecystitis on ultrasound and CT scan.  There was a large stone in the neck of the gallbladder.  Patient was counseled and taken back to the operating room urgently for laparoscopic cholecystectomy.  Findings: Patient had significant edema around the gallbladder as well as large gallstones within the gallbladder.   Details of the procedure: The patient was taken to the operating and placed in the supine position with bilateral SCDs in place.  The patient was prepped and draped in the usual sterile fashion. A time out was called and all facts were verified. A pneumoperitoneum was obtained via A Veress needle technique to a pressure of 26mm of mercury.  A 26mm trochar was then placed in the right upper quadrant under visualization, and there were no injuries to any abdominal organs. A 11 mm port was then placed in the umbilical region after infiltrating with local anesthesia under direct visualization. A second and third epigastric port and right lower quadrant port placement under direct visualization, respectively.    The gallbladder was identified and retracted, the  peritoneum was then sharply dissected from the gallbladder and this dissection was carried down to Calot's triangle. The cystic duct was identified and stripped away circumferentially and seen going into the gallbladder 360, the critical angle was obtained.  2 clips were placed proximally one distally and the cystic duct transected. The cystic artery was identified and 2 clips placed proximally and one distally and transected.  We then proceeded to remove the gallbladder off the hepatic fossa with Bovie cautery.  There was some spillage of bile.  This was irrigated out.  A retrieval bag was then placed in the abdomen and gallbladder placed in the bag. The hepatic fossa was then reexamined and hemostasis was achieved with Bovie cautery and was excellent at the end of the case.   The subhepatic fossa and perihepatic fossa was then irrigated until the effluent was clear.  The gallbladder and bag were removed from the abdominal cavity. The 11 mm trocar fascia was reapproximated with the Endo Close #1 Vicryl x2.  The pneumoperitoneum was evacuated and all trochars removed under direct visulalization.  The skin was then closed with 4-0 Monocryl and the skin dressed with Dermabond.    The patient was awaken from general anesthesia and taken to the recovery room in stable condition.  PLAN OF CARE: Admit for overnight observation  PATIENT DISPOSITION:  PACU - hemodynamically stable.   Delay start of Pharmacological VTE agent (>24hrs) due to surgical blood loss or risk of bleeding: not applicable

## 2020-10-16 ENCOUNTER — Encounter (HOSPITAL_COMMUNITY): Payer: Self-pay | Admitting: General Surgery

## 2020-10-16 NOTE — Progress Notes (Signed)
Patient has ordered for discharge. Given discharge instructions with paper to the patient and family. Iv removed. Given all belongings to the patient and family.

## 2020-10-16 NOTE — Discharge Summary (Signed)
Physician Discharge Summary  Patient ID: Morgan [name] MRN: [id number] DOB/AGE: 06/04/1970 50 y.o.  Admit date: 10/14/2020 Discharge date: 10/16/2020  Admission Diagnoses:  Discharge Diagnoses:  Active Problems:   Acute calculous cholecystitis   Discharged Condition: good  Hospital Course: admitted with cholecystitis.  Taken to OR on 7/3.  Doing well on POD#1.  Pain controlled.  Tolerating po.  Discharged home.  Consults: None  Significant Diagnostic Studies:   Treatments: surgery: laparoscopic cholecystectmy  Discharge Exam: Blood pressure 112/64, pulse 67, temperature 97.9 F (36.6 C), temperature source Oral, resp. rate 16, height 5\' 5"  (1.651 m), weight 69.5 kg, last menstrual period 07/26/2014, SpO2 97 %. General appearance: alert, cooperative, and no distress Resp: clear to auscultation bilaterally Cardio: regular rate and rhythm, S1, S2 normal, no murmur, click, rub or gallop Incision/Wound:abdomen soft, incisions clean  Disposition: Discharge disposition: 01-Home or Self Care       Discharge Instructions     Diet - low sodium heart healthy   Complete by: As directed    Increase activity slowly   Complete by: As directed       Allergies as of 10/16/2020       Reactions   Depakote [divalproex Sodium] Anaphylaxis   Morphine And Related Itching   Nubain [nalbuphine]    Prochlorperazine    Reglan [metoclopramide] Nausea And Vomiting, Other (See Comments)   hyperactivity        Medication List     TAKE these medications    acetaminophen 500 MG tablet Commonly known as: TYLENOL Take 1,000 mg by mouth every 6 (six) hours as needed for mild pain.   amphetamine-dextroamphetamine 30 MG tablet Commonly known as: ADDERALL Take 30 mg by mouth 2 (two) times daily.   DEPLIN 7.5 PO Take 7.5 mg by mouth daily.   esomeprazole 40 MG capsule Commonly known as: NexIUM Take 1 capsule (40 mg total) by mouth 2 (two) times daily before a meal. What changed:  when to take this   estradiol 0.5 MG tablet Commonly known as: ESTRACE Take 0.5 mg by mouth daily.   guanFACINE 2 MG tablet Commonly known as: TENEX Take 2 mg by mouth at bedtime.   ibuprofen 200 MG tablet Commonly known as: ADVIL Take 400 mg by mouth every 6 (six) hours as needed for pain (for pain).   LORazepam 0.5 MG tablet Commonly known as: ATIVAN Take 0.5 mg by mouth daily as needed for anxiety.   Magnesium 250 MG Tabs Take 500 mg by mouth at bedtime.   ondansetron 8 MG tablet Commonly known as: ZOFRAN Take 4-8 mg by mouth every 8 (eight) hours as needed for nausea or vomiting.   ondansetron 4 MG tablet Commonly known as: ZOFRAN Take 4 mg by mouth every 8 (eight) hours as needed for nausea.   traMADol 50 MG tablet Commonly known as: Ultram Take 1 tablet (50 mg total) by mouth every 6 (six) hours as needed.   Ubrelvy 100 MG Tabs Generic drug: Ubrogepant Take 100 tablets by mouth as directed. For migraines   valACYclovir 500 MG tablet Commonly known as: VALTREX Take 500 mg by mouth daily as needed (for out breaks).   vortioxetine HBr 20 MG Tabs tablet Commonly known as: TRINTELLIX Take 20 mg by mouth daily.        Follow-up [facility], [place]. Schedule an appointment as soon as possible for a visit in 2 week(s).   Specialty: General Surgery Why: Post op  visit Contact information: [address] [place] [phone]                Signed: Coralie Keens 10/16/2020, 8:50 AM

## 2020-10-17 LAB — SURGICAL PATHOLOGY

## 2020-11-10 DIAGNOSIS — F339 Major depressive disorder, recurrent, unspecified: Secondary | ICD-10-CM | POA: Diagnosis not present

## 2020-12-01 DIAGNOSIS — F339 Major depressive disorder, recurrent, unspecified: Secondary | ICD-10-CM | POA: Diagnosis not present

## 2020-12-13 IMAGING — US US ABDOMEN LIMITED
1 series · 14 of 25 positions shown · non-contrast
Comparison: None.

CLINICAL DATA: Nausea.

EXAM:
ULTRASOUND ABDOMEN LIMITED RIGHT UPPER QUADRANT

[Series 1: us abdomen limited · 14 of 53 slices shown]
[im 1/53]
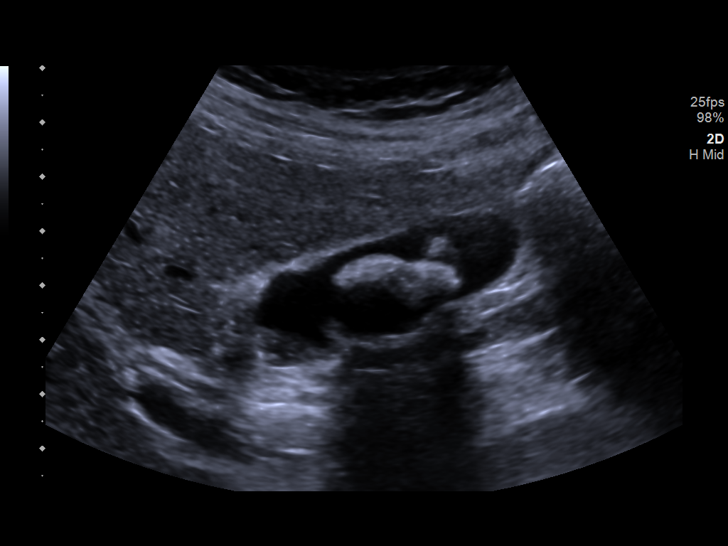
[im 5/53]
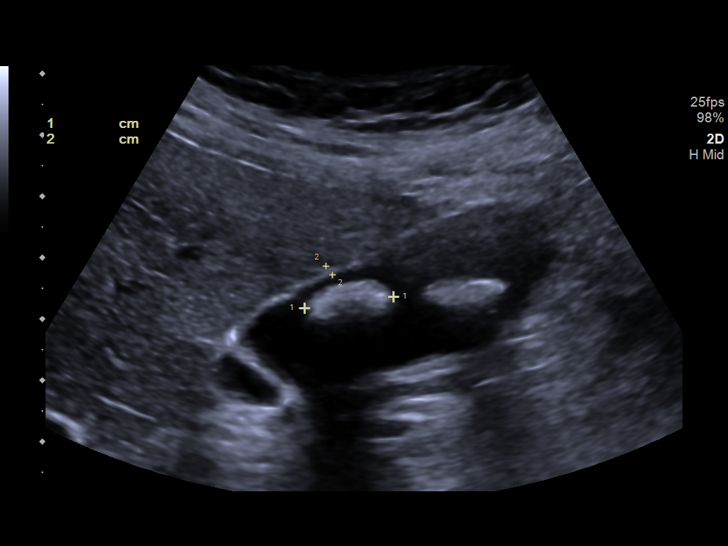
[im 9/53]
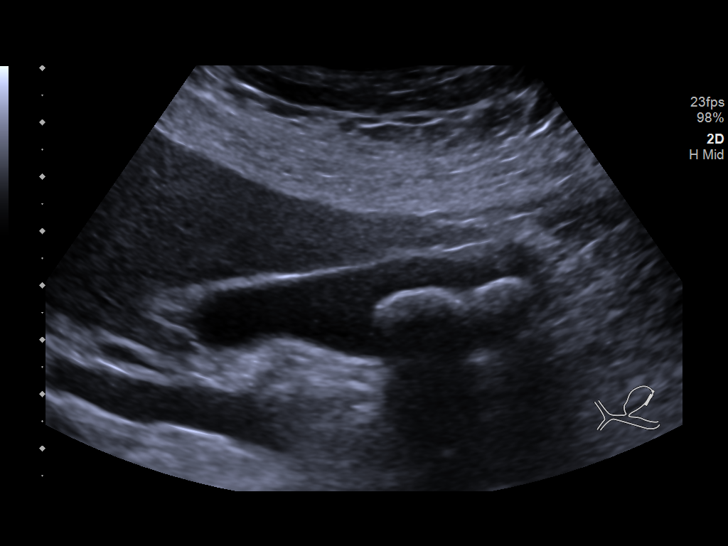
[im 14/53]
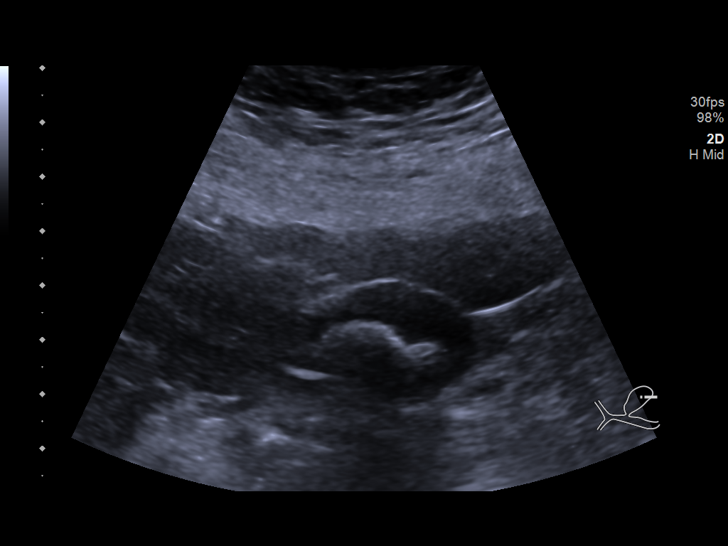
[im 18/53]
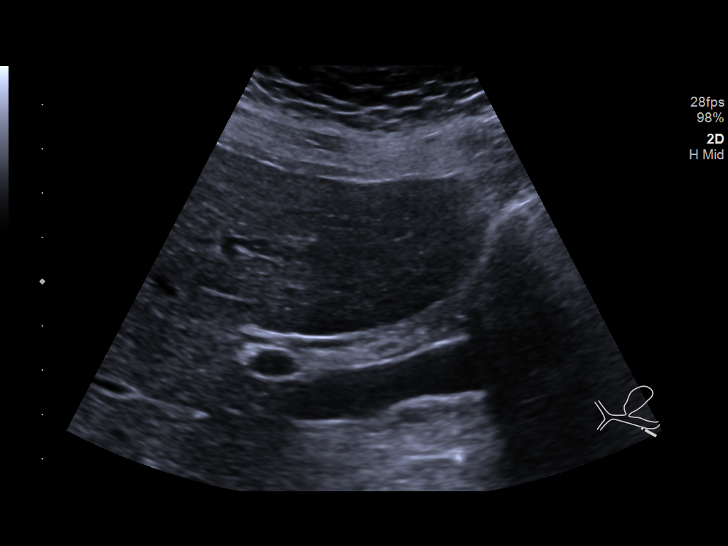
[im 20/53]
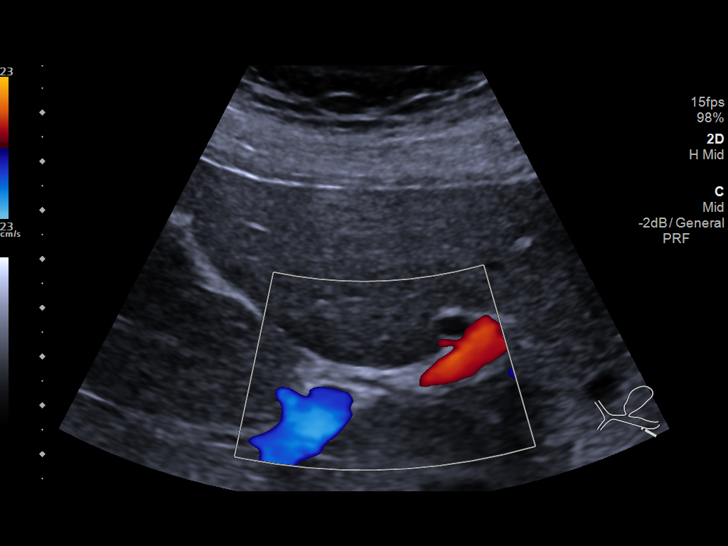
[im 24/53]
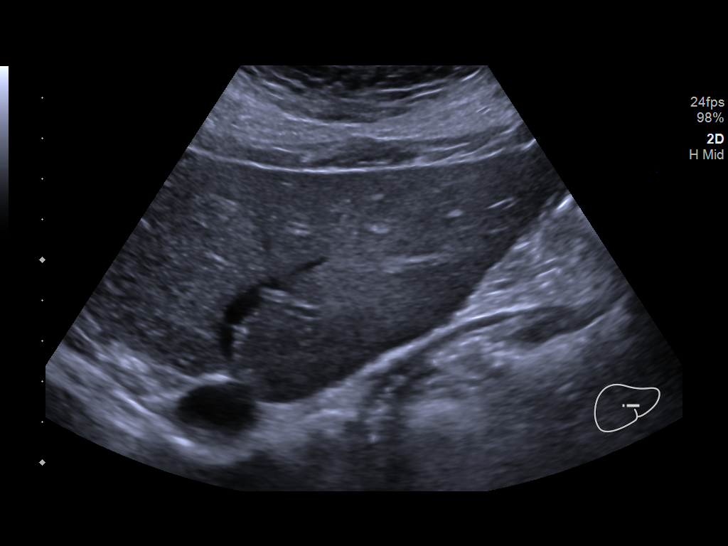
[im 29/53]
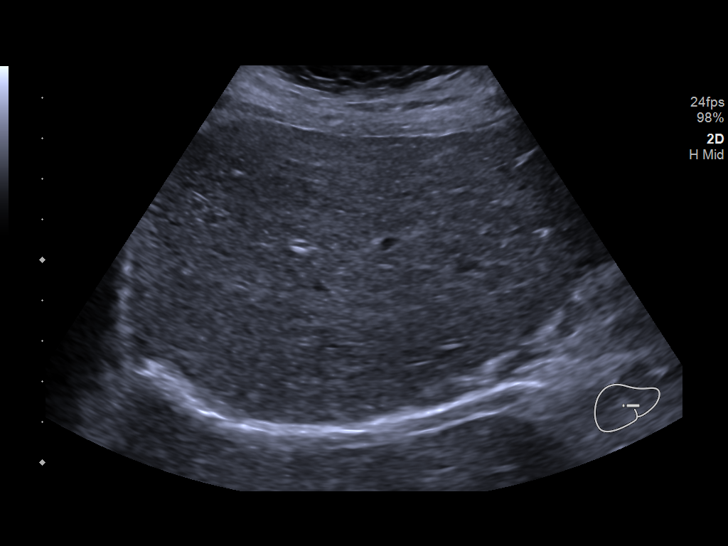
[im 33/53]
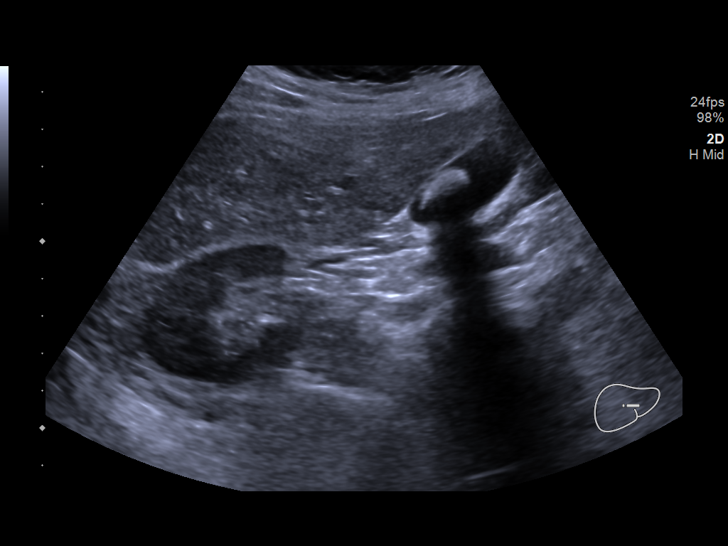
[im 35/53]
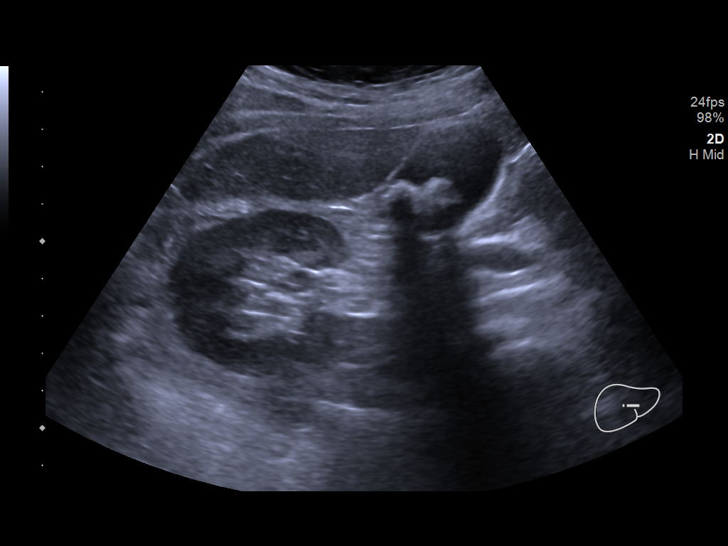
[im 40/53]
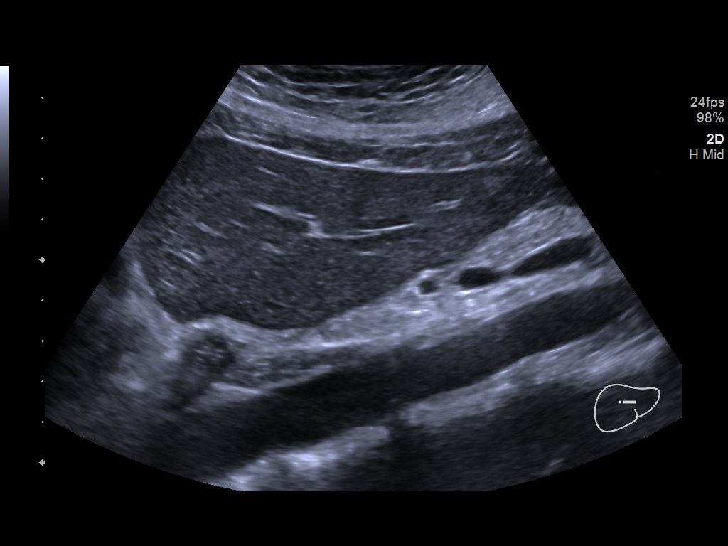
[im 44/53]
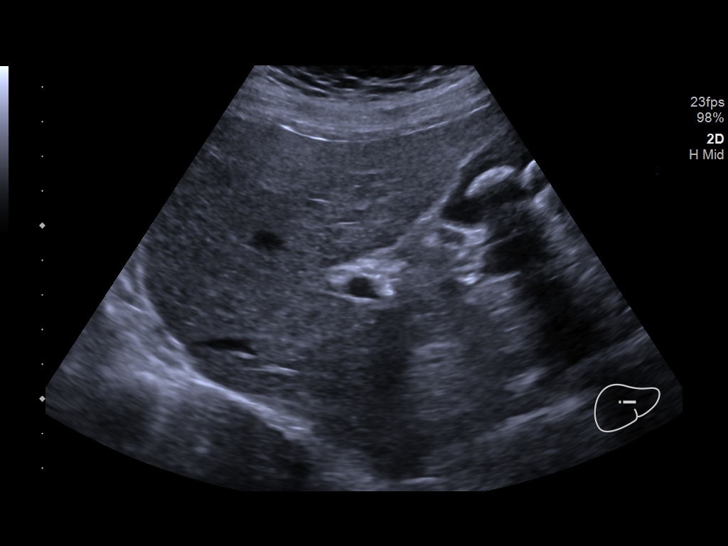
[im 48/53]
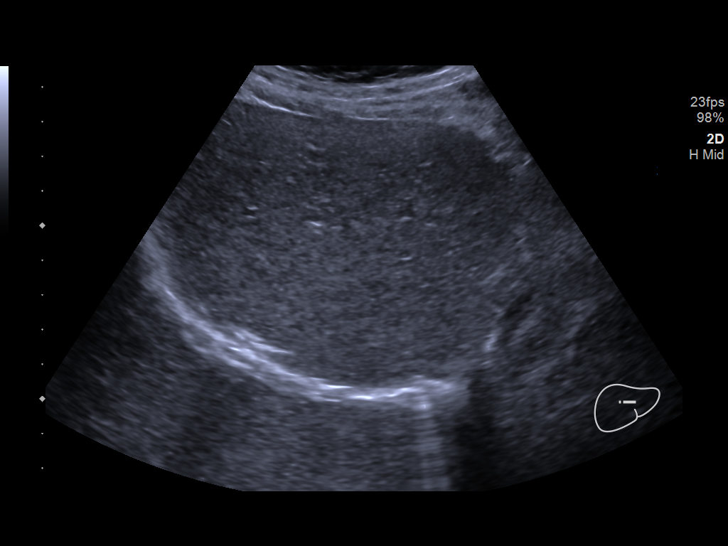
[im 53/53]
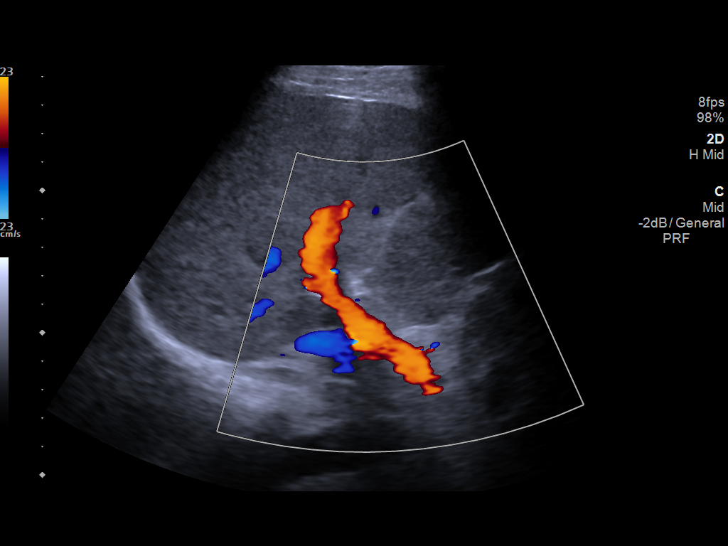

[14 of 25 positions shown; findings below may reference images not displayed]

FINDINGS: Gallbladder:

There are multiple mobile gallstones. Wall thickness is normal.
Negative sonographic Murphy's sign.

Common bile duct:

Diameter: 2.6 mm, normal.

Liver:

No focal lesion identified. Within normal limits in parenchymal
echogenicity. Portal vein is patent on color Doppler imaging with
normal direction of blood flow towards the liver.

Other: None.
IMPRESSION: Cholelithiasis.

## 2020-12-14 DIAGNOSIS — F339 Major depressive disorder, recurrent, unspecified: Secondary | ICD-10-CM | POA: Diagnosis not present

## 2021-01-03 DIAGNOSIS — F900 Attention-deficit hyperactivity disorder, predominantly inattentive type: Secondary | ICD-10-CM | POA: Diagnosis not present

## 2021-01-03 DIAGNOSIS — F3342 Major depressive disorder, recurrent, in full remission: Secondary | ICD-10-CM | POA: Diagnosis not present

## 2021-01-30 DIAGNOSIS — J3489 Other specified disorders of nose and nasal sinuses: Secondary | ICD-10-CM | POA: Diagnosis not present

## 2021-01-30 DIAGNOSIS — S0031XA Abrasion of nose, initial encounter: Secondary | ICD-10-CM | POA: Diagnosis not present

## 2021-01-30 DIAGNOSIS — S0033XA Contusion of nose, initial encounter: Secondary | ICD-10-CM | POA: Diagnosis not present

## 2021-03-05 DIAGNOSIS — R7301 Impaired fasting glucose: Secondary | ICD-10-CM | POA: Diagnosis not present

## 2021-03-05 DIAGNOSIS — Z23 Encounter for immunization: Secondary | ICD-10-CM | POA: Diagnosis not present

## 2021-06-26 DIAGNOSIS — F3342 Major depressive disorder, recurrent, in full remission: Secondary | ICD-10-CM | POA: Diagnosis not present

## 2021-06-26 DIAGNOSIS — F900 Attention-deficit hyperactivity disorder, predominantly inattentive type: Secondary | ICD-10-CM | POA: Diagnosis not present

## 2021-06-26 DIAGNOSIS — F411 Generalized anxiety disorder: Secondary | ICD-10-CM | POA: Diagnosis not present

## 2021-08-31 DIAGNOSIS — R7301 Impaired fasting glucose: Secondary | ICD-10-CM | POA: Diagnosis not present

## 2021-08-31 DIAGNOSIS — E559 Vitamin D deficiency, unspecified: Secondary | ICD-10-CM | POA: Diagnosis not present

## 2021-08-31 DIAGNOSIS — Z0000 Encounter for general adult medical examination without abnormal findings: Secondary | ICD-10-CM | POA: Diagnosis not present

## 2021-08-31 DIAGNOSIS — I700 Atherosclerosis of aorta: Secondary | ICD-10-CM | POA: Diagnosis not present

## 2021-08-31 DIAGNOSIS — R7989 Other specified abnormal findings of blood chemistry: Secondary | ICD-10-CM | POA: Diagnosis not present

## 2021-09-07 DIAGNOSIS — Z0000 Encounter for general adult medical examination without abnormal findings: Secondary | ICD-10-CM | POA: Diagnosis not present

## 2021-09-07 DIAGNOSIS — Z1339 Encounter for screening examination for other mental health and behavioral disorders: Secondary | ICD-10-CM | POA: Diagnosis not present

## 2021-09-07 DIAGNOSIS — R7301 Impaired fasting glucose: Secondary | ICD-10-CM | POA: Diagnosis not present

## 2021-09-07 DIAGNOSIS — Z1331 Encounter for screening for depression: Secondary | ICD-10-CM | POA: Diagnosis not present

## 2021-09-11 DIAGNOSIS — R82998 Other abnormal findings in urine: Secondary | ICD-10-CM | POA: Diagnosis not present

## 2021-09-12 DIAGNOSIS — Z1231 Encounter for screening mammogram for malignant neoplasm of breast: Secondary | ICD-10-CM | POA: Diagnosis not present

## 2021-09-12 DIAGNOSIS — Z6825 Body mass index (BMI) 25.0-25.9, adult: Secondary | ICD-10-CM | POA: Diagnosis not present

## 2021-09-12 DIAGNOSIS — Z01419 Encounter for gynecological examination (general) (routine) without abnormal findings: Secondary | ICD-10-CM | POA: Diagnosis not present

## 2021-11-05 DIAGNOSIS — D2271 Melanocytic nevi of right lower limb, including hip: Secondary | ICD-10-CM | POA: Diagnosis not present

## 2021-11-05 DIAGNOSIS — L738 Other specified follicular disorders: Secondary | ICD-10-CM | POA: Diagnosis not present

## 2021-11-05 DIAGNOSIS — L550 Sunburn of first degree: Secondary | ICD-10-CM | POA: Diagnosis not present

## 2021-11-05 DIAGNOSIS — D225 Melanocytic nevi of trunk: Secondary | ICD-10-CM | POA: Diagnosis not present

## 2021-12-04 DIAGNOSIS — N3946 Mixed incontinence: Secondary | ICD-10-CM | POA: Diagnosis not present

## 2021-12-25 DIAGNOSIS — F3342 Major depressive disorder, recurrent, in full remission: Secondary | ICD-10-CM | POA: Diagnosis not present

## 2021-12-25 DIAGNOSIS — F411 Generalized anxiety disorder: Secondary | ICD-10-CM | POA: Diagnosis not present

## 2021-12-25 DIAGNOSIS — F900 Attention-deficit hyperactivity disorder, predominantly inattentive type: Secondary | ICD-10-CM | POA: Diagnosis not present

## 2022-03-28 DIAGNOSIS — Z23 Encounter for immunization: Secondary | ICD-10-CM | POA: Diagnosis not present

## 2022-03-28 DIAGNOSIS — I700 Atherosclerosis of aorta: Secondary | ICD-10-CM | POA: Diagnosis not present

## 2022-03-28 DIAGNOSIS — R7301 Impaired fasting glucose: Secondary | ICD-10-CM | POA: Diagnosis not present

## 2022-04-29 IMAGING — CT CT ABD-PELV W/ CM
2 of 6 series · 17 of 46 positions shown, 19 images · IV contrast (APPLIED)
Comparison: Abdominal ultrasound performed today.

CLINICAL DATA: 49-year-old female with acute abdominal and pelvic
pain today.

EXAM:
CT ABDOMEN AND PELVIS WITH CONTRAST
TECHNIQUE: Multidetector CT imaging of the abdomen and pelvis was performed
using the standard protocol following bolus administration of
intravenous contrast.
CONTRAST:  100mL OMNIPAQUE IOHEXOL 300 MG/ML  SOLN

[Series 4: abdomen 5.0 · axial · 0.85mm/px · z∈[+924,+1304]mm · 14 of 88 slices shown, 16 images]
[im 6/88  soft-tissue]
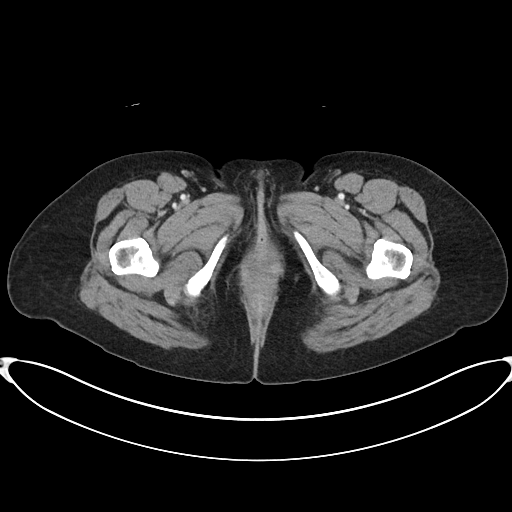
[im 6/88  bone]
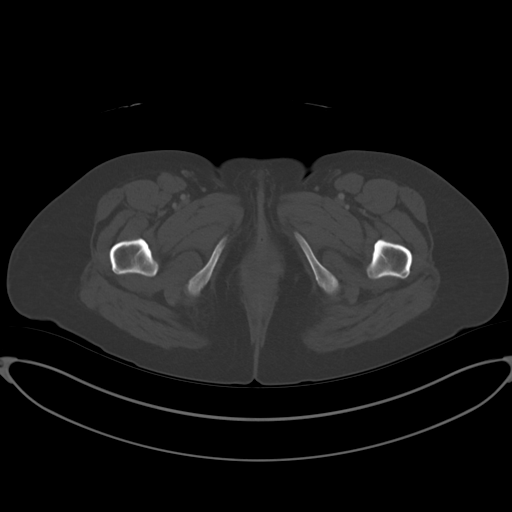
[im 12/88  soft-tissue]
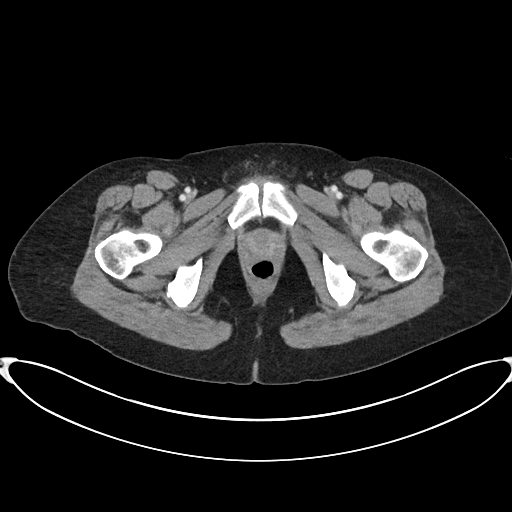
[im 18/88  soft-tissue]
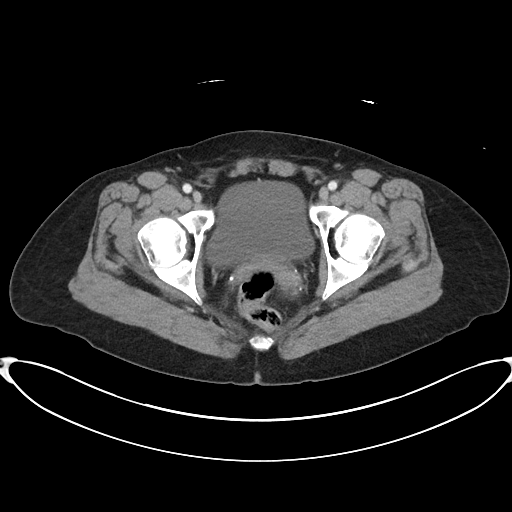
[im 24/88  soft-tissue]
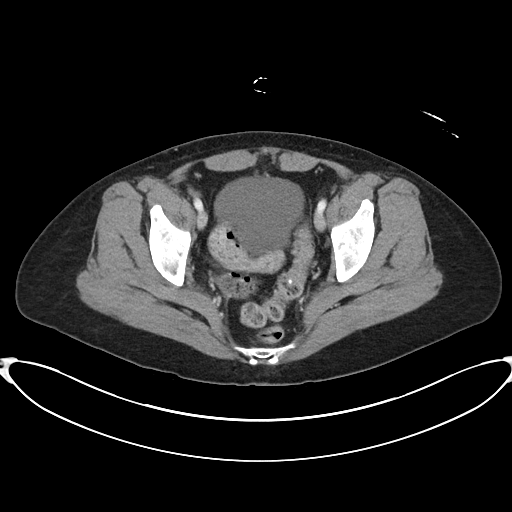
[im 30/88  soft-tissue]
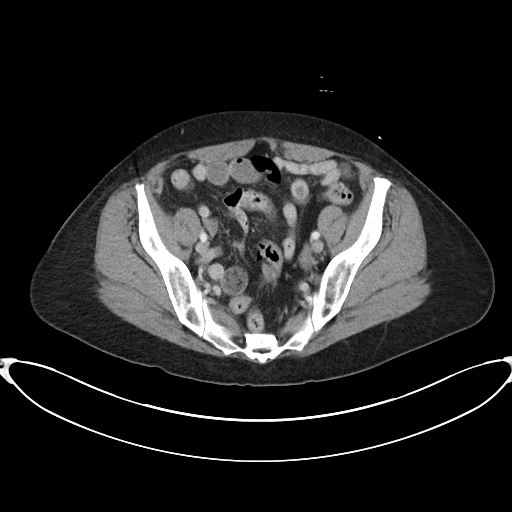
[im 35/88  soft-tissue]
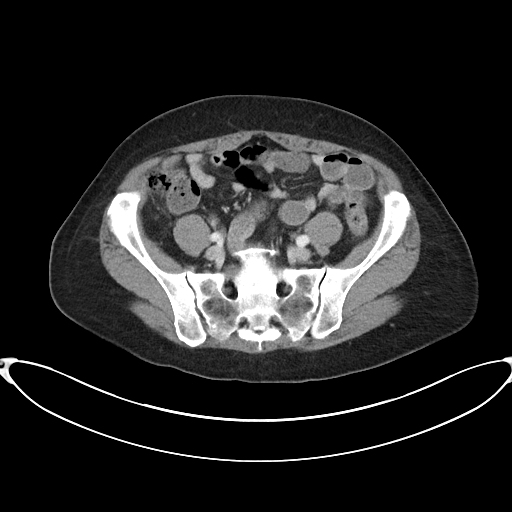
[im 41/88  soft-tissue]
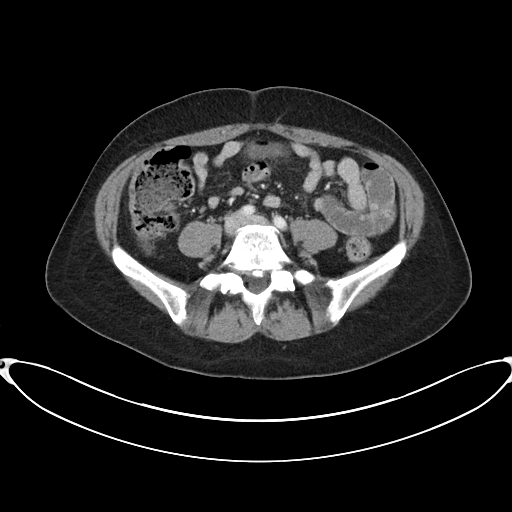
[im 47/88  soft-tissue]
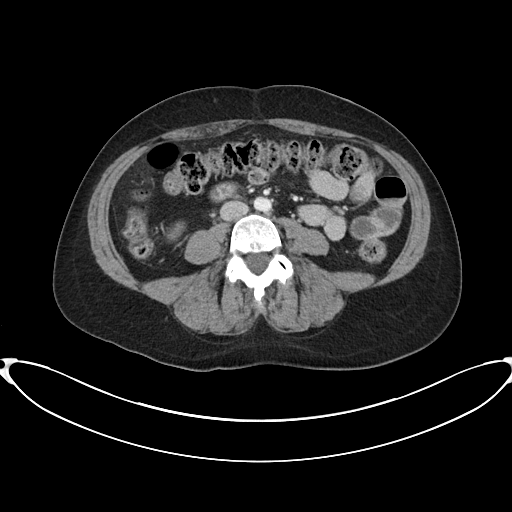
[im 53/88  soft-tissue]
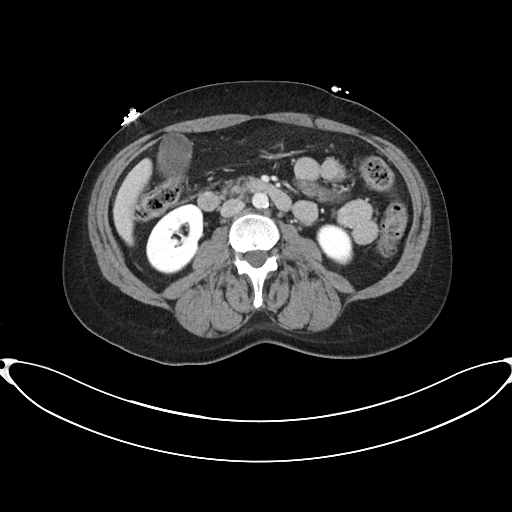
[im 53/88  bone]
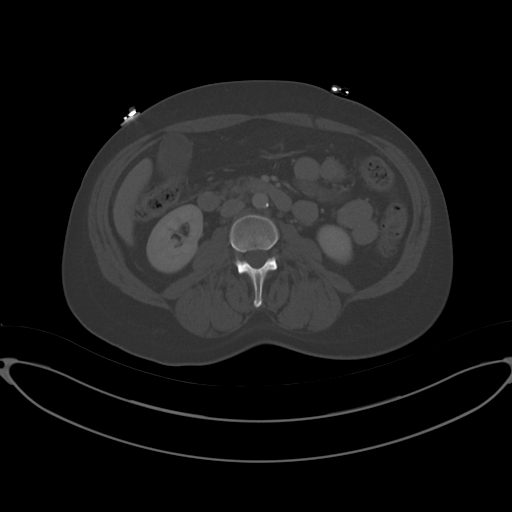
[im 59/88  soft-tissue]
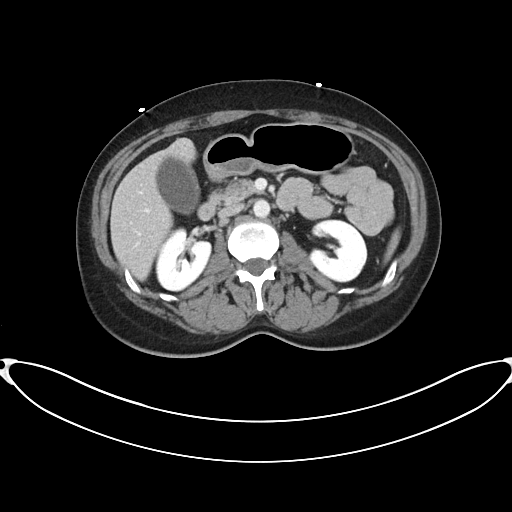
[im 64/88  soft-tissue]
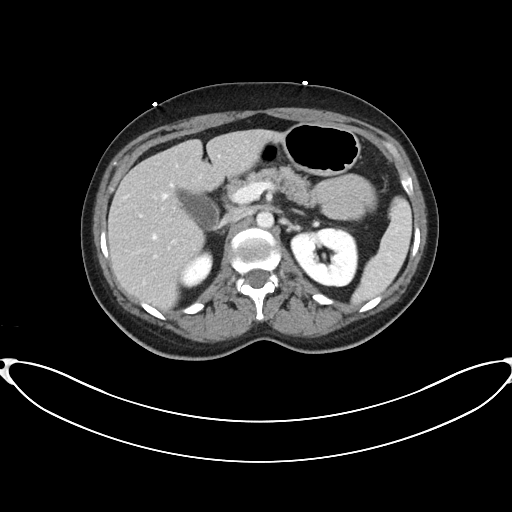
[im 70/88  soft-tissue]
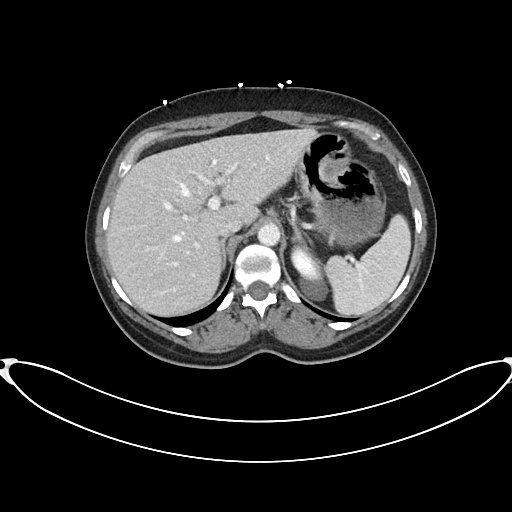
[im 76/88  soft-tissue]
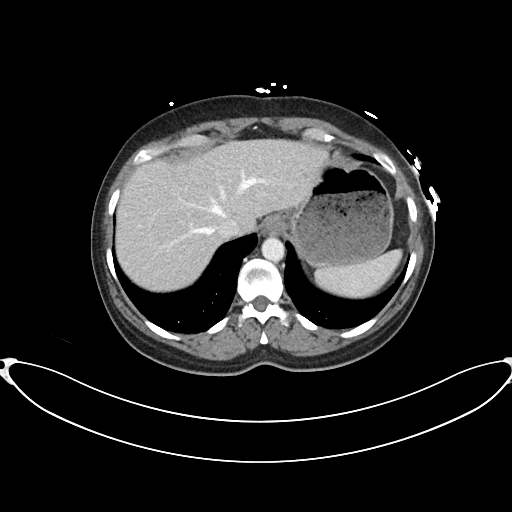
[im 82/88  soft-tissue]
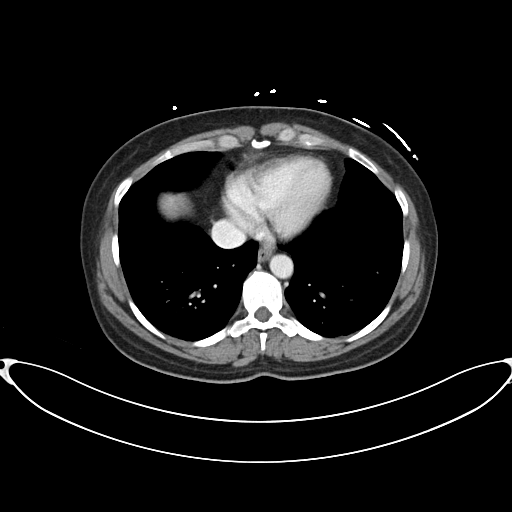

[Series 8: abdomen 3.0 mpr cor · coronal · 0.84mm/px · 3 of 95 slices shown]
[im 24/95  soft-tissue]
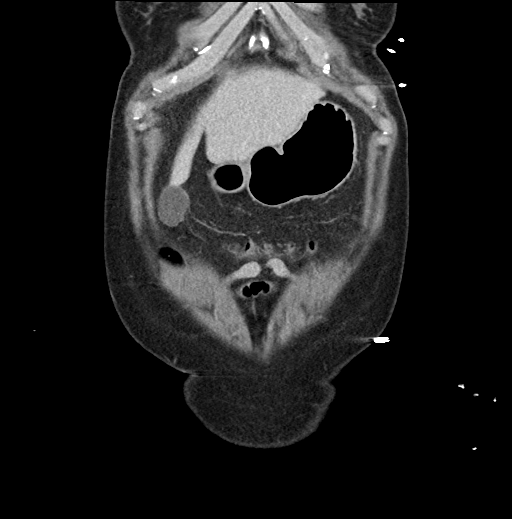
[im 48/95  soft-tissue]
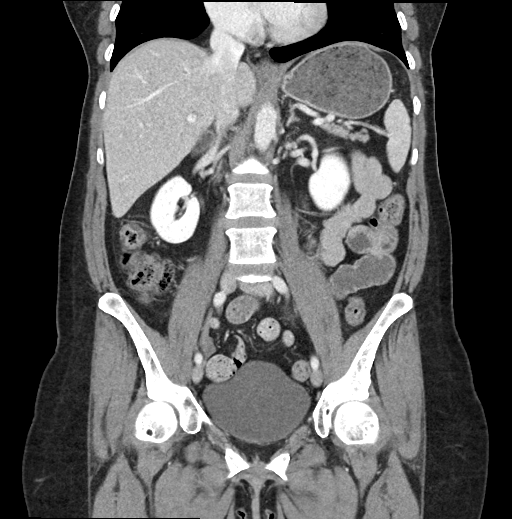
[im 71/95  soft-tissue]
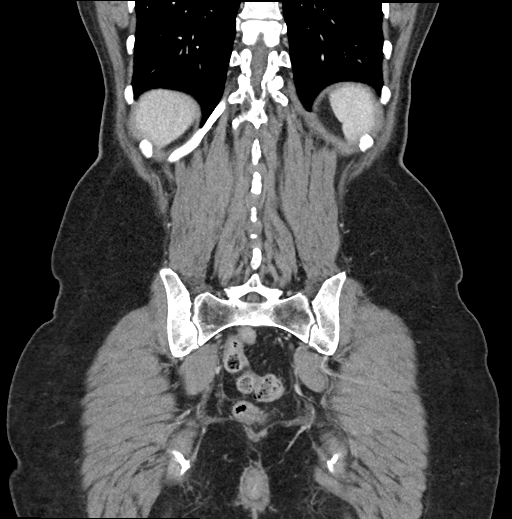

[17 of 46 positions shown; findings below may reference images not displayed]

FINDINGS: Lower chest: No acute abnormality

Hepatobiliary: Cholelithiasis identified. There is mild gallbladder
wall thickening and possible very mild pericholecystic stranding
equivocal for acute cholecystitis. No biliary dilatation. The liver
is unremarkable.

Pancreas: Unremarkable

Spleen: Unremarkable

Adrenals/Urinary Tract: The kidneys, adrenal glands and bladder are
unremarkable except for a LEFT renal cyst.

Stomach/Bowel: Stomach is within normal limits. Appendix appears
normal. No evidence of bowel wall thickening, distention, or
inflammatory changes.

Vascular/Lymphatic: Aortic atherosclerosis. No enlarged abdominal or
pelvic lymph nodes.

Reproductive: Status post hysterectomy. No adnexal masses.

Other: No ascites, pneumoperitoneum or focal collection.

Musculoskeletal: No acute or suspicious bony abnormalities are
noted. Moderate degenerative disc disease at L5-S1 noted.
IMPRESSION: 1. Cholelithiasis with mild gallbladder wall thickening and possible
very mild pericholecystic stranding equivocal for acute
cholecystitis. No biliary dilatation.
2. Normal appendix.
3. Aortic Atherosclerosis ([id number]).

## 2022-04-29 IMAGING — US US ABDOMEN LIMITED
1 series · 14 of 25 positions shown · non-contrast
Comparison: 05/31/2019

CLINICAL DATA: 49-year-old female with RIGHT UPPER quadrant
abdominal pain today.

EXAM:
ULTRASOUND ABDOMEN LIMITED RIGHT UPPER QUADRANT

[Series 1: us abdomen limited ruq (liver/gb) · 14 of 76 slices shown]
[im 1/76]
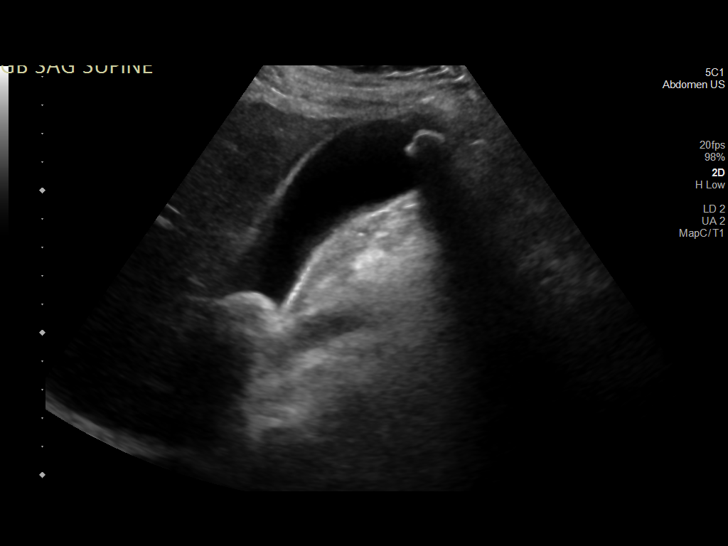
[im 7/76]
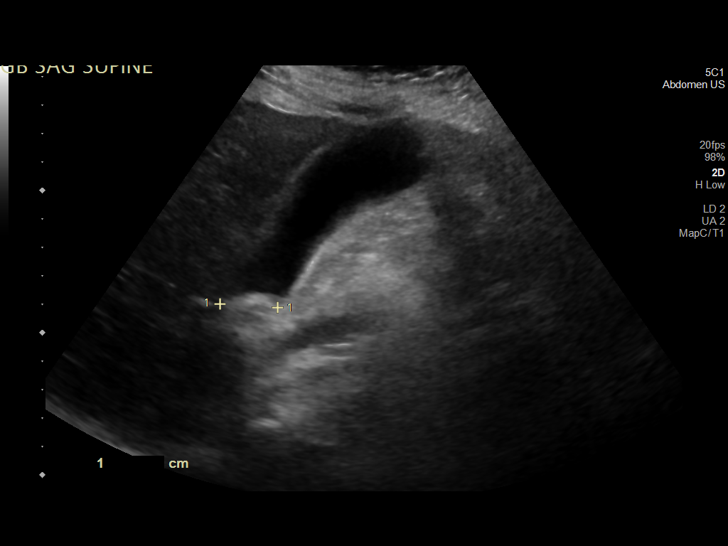
[im 13/76]
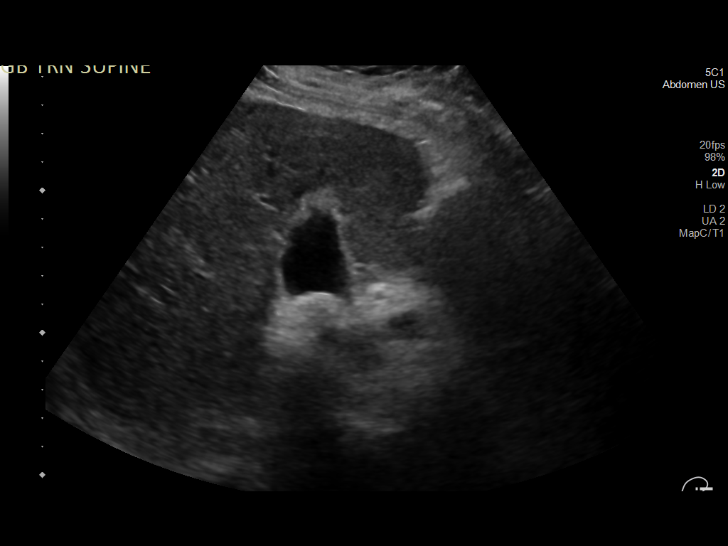
[im 19/76]
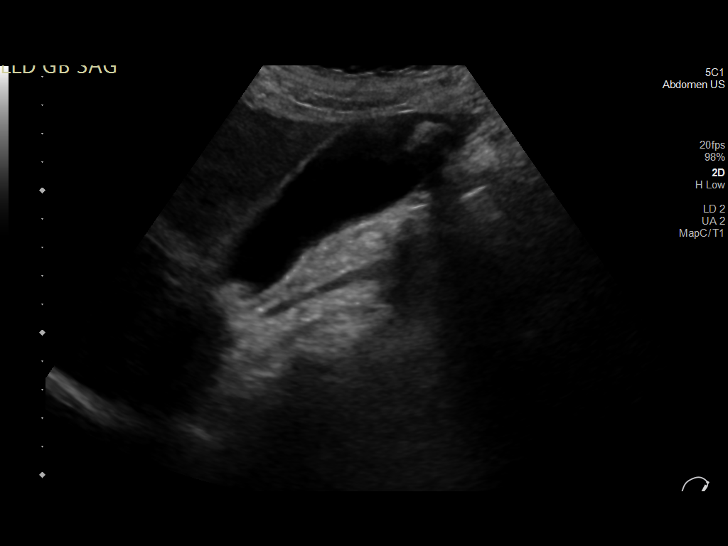
[im 26/76]
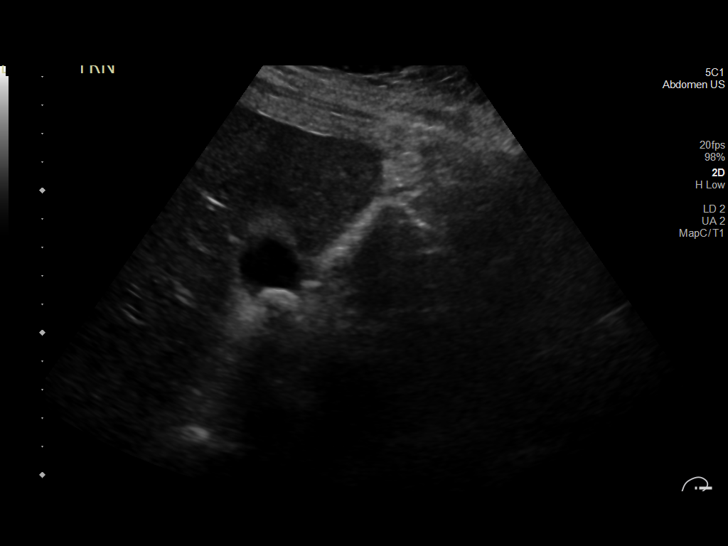
[im 29/76]
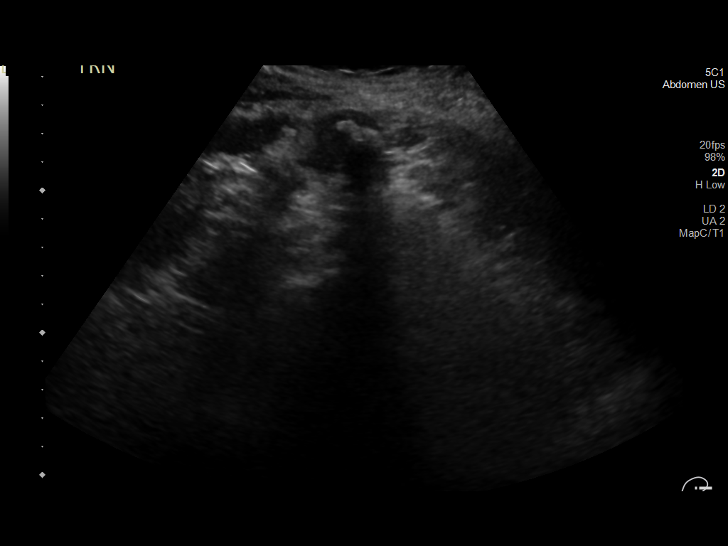
[im 35/76]
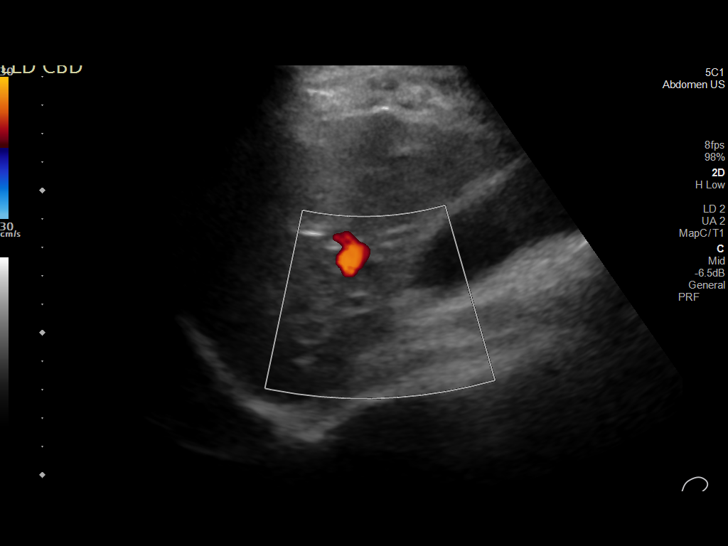
[im 41/76]
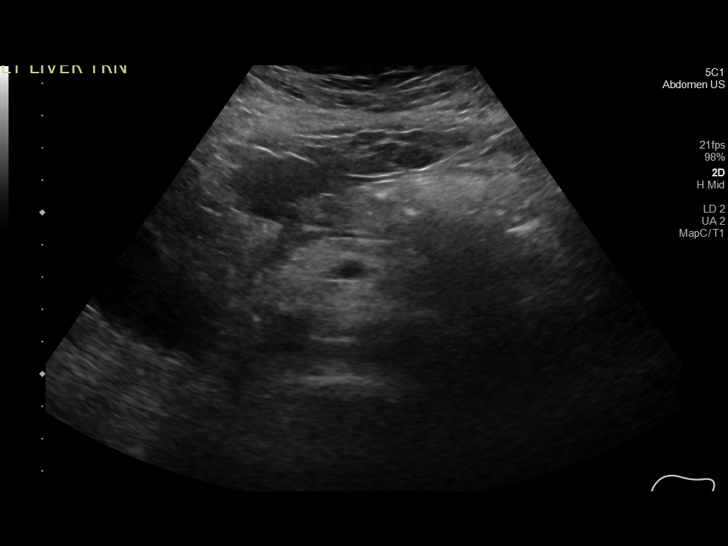
[im 47/76]
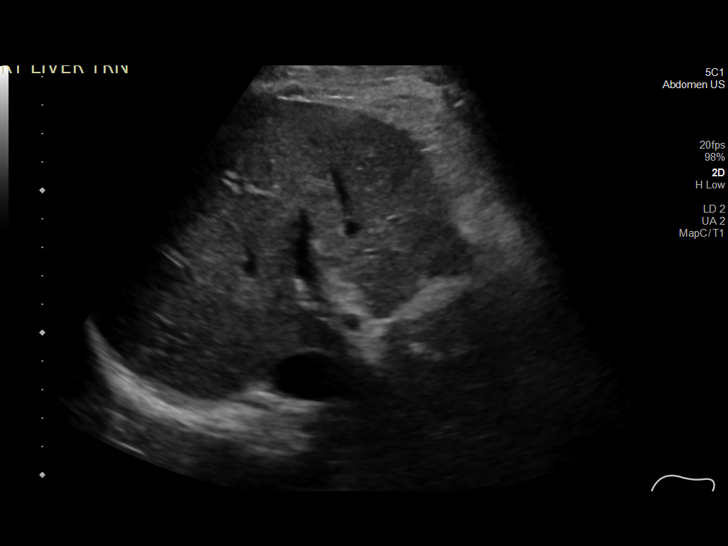
[im 51/76]
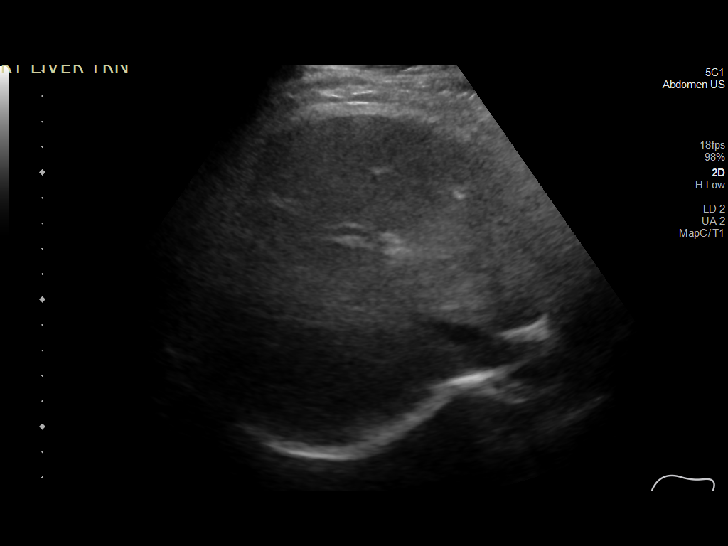
[im 57/76]
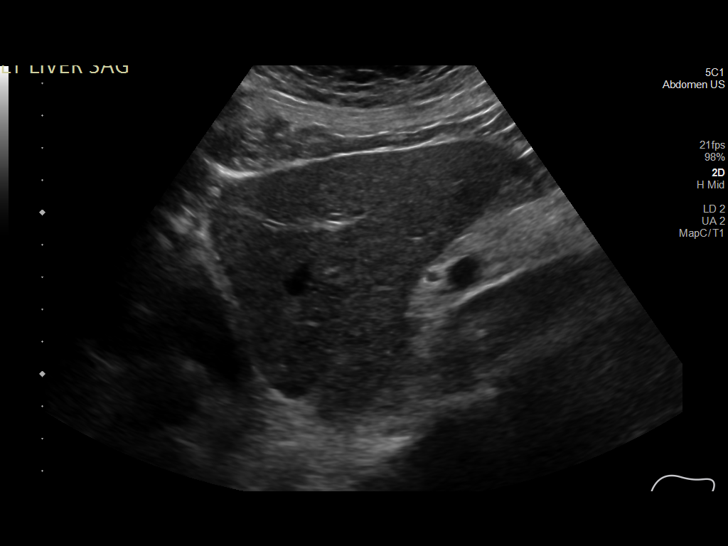
[im 63/76]
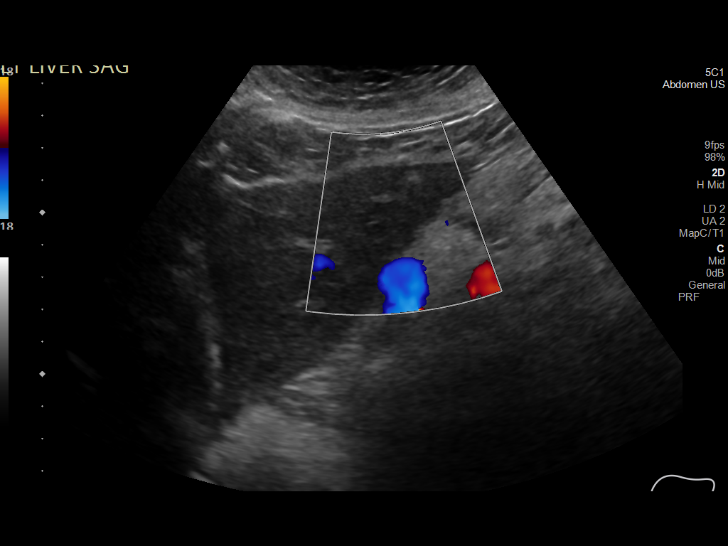
[im 69/76]
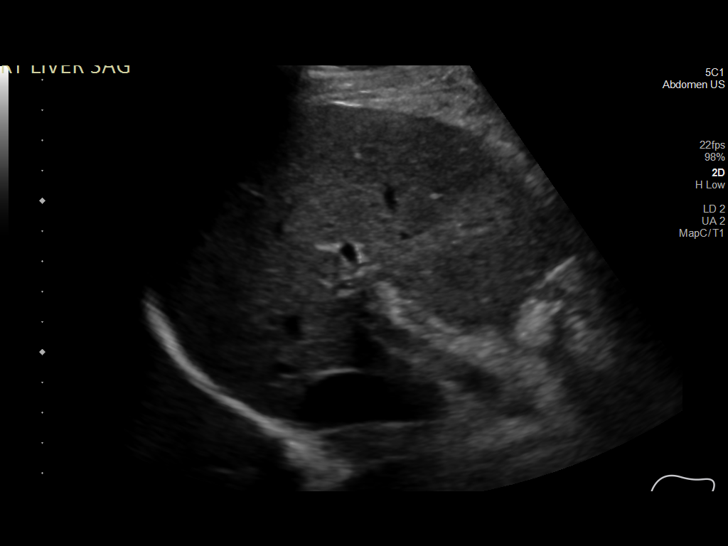
[im 76/76]
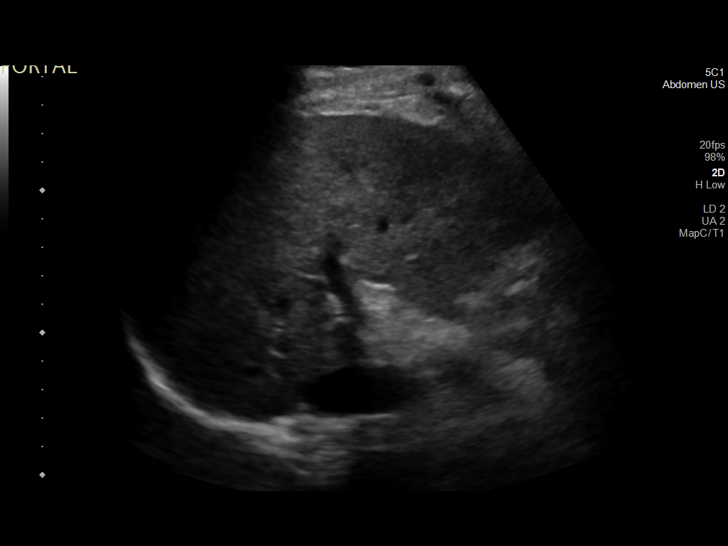

[14 of 25 positions shown; findings below may reference images not displayed]

FINDINGS: Gallbladder:

Cholelithiasis again identified. A 2 cm gallstone is noted at the
gallbladder neck. There is no evidence of gallbladder wall
thickening, pericholecystic fluid or sonographic Murphy sign.

Common bile duct:

Diameter: 3 mm. No intrahepatic or extrahepatic biliary dilatation
identified.

Liver:

No suspicious focal abnormalities noted. Small hyperechoic area
within the LEFT liver appears unchanged.

Echogenicity is within normal limits. Portal vein is patent on color
Doppler imaging with normal direction of blood flow towards the
liver.

Other: None.
IMPRESSION: 1. Cholelithiasis with 2 cm gallstone at the gallbladder neck. No
current sonographic evidence of acute cholecystitis.
2. No other significant abnormalities.  No biliary dilatation.

## 2022-05-08 DIAGNOSIS — F4323 Adjustment disorder with mixed anxiety and depressed mood: Secondary | ICD-10-CM | POA: Diagnosis not present

## 2022-05-16 DIAGNOSIS — F4323 Adjustment disorder with mixed anxiety and depressed mood: Secondary | ICD-10-CM | POA: Diagnosis not present

## 2022-05-23 DIAGNOSIS — F4323 Adjustment disorder with mixed anxiety and depressed mood: Secondary | ICD-10-CM | POA: Diagnosis not present

## 2022-05-27 ENCOUNTER — Encounter: Payer: Self-pay | Admitting: Obstetrics and Gynecology

## 2022-05-27 ENCOUNTER — Ambulatory Visit: Payer: BC Managed Care – PPO | Admitting: Obstetrics and Gynecology

## 2022-05-27 VITALS — BP 151/92 | HR 86 | Ht 63.25 in | Wt 153.0 lb

## 2022-05-27 DIAGNOSIS — N393 Stress incontinence (female) (male): Secondary | ICD-10-CM

## 2022-05-27 DIAGNOSIS — R350 Frequency of micturition: Secondary | ICD-10-CM

## 2022-05-27 LAB — POCT URINALYSIS DIPSTICK
Bilirubin, UA: NEGATIVE
Glucose, UA: NEGATIVE
Ketones, UA: NEGATIVE
Leukocytes, UA: NEGATIVE
Nitrite, UA: NEGATIVE
Protein, UA: NEGATIVE
Spec Grav, UA: 1.025 (ref 1.010–1.025)
Urobilinogen, UA: 0.2 E.U./dL
pH, UA: 6.5 (ref 5.0–8.0)
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
## 2022-05-27 NOTE — Patient Instructions (Signed)
For treatment of stress urinary incontinence, which is leakage with physical activity/movement/strainging/coughing, we discussed expectant management versus nonsurgical options versus surgery. Nonsurgical options include weight loss, physical therapy, as well as a pessary.  Surgical options include a midurethral sling, which is a synthetic mesh sling that acts like a hammock under the urethra to prevent leakage of urine, and transurethral injection of a bulking agent.

## 2022-05-30 DIAGNOSIS — F4323 Adjustment disorder with mixed anxiety and depressed mood: Secondary | ICD-10-CM | POA: Diagnosis not present

## 2022-06-03 ENCOUNTER — Encounter: Payer: Self-pay | Admitting: *Deleted

## 2022-06-13 DIAGNOSIS — F4323 Adjustment disorder with mixed anxiety and depressed mood: Secondary | ICD-10-CM | POA: Diagnosis not present

## 2022-06-20 DIAGNOSIS — F3342 Major depressive disorder, recurrent, in full remission: Secondary | ICD-10-CM | POA: Diagnosis not present

## 2022-06-20 DIAGNOSIS — F900 Attention-deficit hyperactivity disorder, predominantly inattentive type: Secondary | ICD-10-CM | POA: Diagnosis not present

## 2022-06-20 DIAGNOSIS — F411 Generalized anxiety disorder: Secondary | ICD-10-CM | POA: Diagnosis not present

## 2022-06-26 DIAGNOSIS — F4323 Adjustment disorder with mixed anxiety and depressed mood: Secondary | ICD-10-CM | POA: Diagnosis not present

## 2022-07-24 DIAGNOSIS — F4323 Adjustment disorder with mixed anxiety and depressed mood: Secondary | ICD-10-CM | POA: Diagnosis not present

## 2022-08-19 DIAGNOSIS — F4323 Adjustment disorder with mixed anxiety and depressed mood: Secondary | ICD-10-CM | POA: Diagnosis not present

## 2022-09-04 DIAGNOSIS — F4323 Adjustment disorder with mixed anxiety and depressed mood: Secondary | ICD-10-CM | POA: Diagnosis not present

## 2022-09-06 DIAGNOSIS — I700 Atherosclerosis of aorta: Secondary | ICD-10-CM | POA: Diagnosis not present

## 2022-09-06 DIAGNOSIS — R7989 Other specified abnormal findings of blood chemistry: Secondary | ICD-10-CM | POA: Diagnosis not present

## 2022-09-06 DIAGNOSIS — R7301 Impaired fasting glucose: Secondary | ICD-10-CM | POA: Diagnosis not present

## 2022-09-06 DIAGNOSIS — D649 Anemia, unspecified: Secondary | ICD-10-CM | POA: Diagnosis not present

## 2022-09-06 DIAGNOSIS — E559 Vitamin D deficiency, unspecified: Secondary | ICD-10-CM | POA: Diagnosis not present

## 2022-09-17 DIAGNOSIS — R7301 Impaired fasting glucose: Secondary | ICD-10-CM | POA: Diagnosis not present

## 2022-09-17 DIAGNOSIS — Z1339 Encounter for screening examination for other mental health and behavioral disorders: Secondary | ICD-10-CM | POA: Diagnosis not present

## 2022-09-17 DIAGNOSIS — Z1331 Encounter for screening for depression: Secondary | ICD-10-CM | POA: Diagnosis not present

## 2022-09-17 DIAGNOSIS — Z0000 Encounter for general adult medical examination without abnormal findings: Secondary | ICD-10-CM | POA: Diagnosis not present

## 2022-09-17 DIAGNOSIS — R82998 Other abnormal findings in urine: Secondary | ICD-10-CM | POA: Diagnosis not present

## 2022-09-24 ENCOUNTER — Ambulatory Visit: Payer: BC Managed Care – PPO | Admitting: Obstetrics and Gynecology

## 2022-09-24 ENCOUNTER — Encounter: Payer: Self-pay | Admitting: Obstetrics and Gynecology

## 2022-09-24 VITALS — BP 128/80 | HR 92

## 2022-09-24 DIAGNOSIS — N393 Stress incontinence (female) (male): Secondary | ICD-10-CM | POA: Diagnosis not present

## 2022-09-24 MED ORDER — ESTRADIOL 0.1 MG/GM VA CREA: 0.5000 g | TOPICAL_CREAM | VAGINAL | 11 refills | Status: AC

## 2022-09-24 NOTE — Patient Instructions (Addendum)
Practice trying to take it in and out at home. Use lubrication when inserting.  Do the estrogen nightly for 2 weeks and then twice a week after.

## 2022-09-24 NOTE — Progress Notes (Signed)
[facility]   Subjective:     Chief Complaint: Pessary fitting Morgan [name] is a 52 y.o. female is here for pessary fitting.)  History of Present Illness: Morgan [name] is a 51 y.o. female with stress incontinence who presents today for a pessary fitting.    Past Medical History: Patient  has a past medical history of ADHD (attention deficit hyperactivity disorder), Anxiety, Depression, Drug overdose (05/26/12, 02/11/13), Fibroids, GERD (gastroesophageal reflux disease), Headache, Herpes, and Seasonal allergies.   Past Surgical History: She  has a past surgical history that includes Cesarean section; Tonsillectomy; right foot surgery (2007); Robotic assisted total hysterectomy (N/A, 08/10/2014); Bilateral salpingectomy (N/A, 08/10/2014); Cystoscopy (N/A, 08/10/2014); and Cholecystectomy (N/A, 10/15/2020).   Medications: She has a current medication list which includes the following prescription(s): acetaminophen, amphetamine-dextroamphetamine, esomeprazole, estradiol, ibuprofen, l-methylfolate-algae, lorazepam, magnesium, ondansetron, rosuvastatin, ubrelvy, valacyclovir, vitamin d (ergocalciferol), and vortioxetine hbr.   Allergies: Patient is allergic to depakote [divalproex sodium], morphine and codeine, nubain [nalbuphine], prochlorperazine, and reglan [metoclopramide].   Social History: Patient  reports that she quit smoking about 19 years ago. Her smoking use included cigarettes. She has a 2.50 pack-year smoking history. She has never used smokeless tobacco. She reports current alcohol use of about 14.0 standard drinks of alcohol per week. She reports current drug use. Drug: Marijuana.      Objective:    BP 128/80   Pulse 92   LMP 07/26/2014 (Approximate)  Gen: No apparent distress, A&O x 3. Pelvic Exam: Normal external female genitalia; Bartholin's and Skene's glands normal in appearance; urethral meatus normal in appearance, no urethral masses or discharge.    A size #2 incontinence ring pessary (Lot [id number]) was fitted. It was comfortable, stayed in place with valsalva and was an appropriate size on examination, with one finger fitting between the pessary and the vaginal walls. Patient was able to attempt urination with pessary in and it was not expelled. Patient attempted removal and was unable to comfortably get the pessary out. She reports she will try at home.   Assessment/Plan:    Assessment: Ms. Morgan [name] is a 52 y.o. with stress incontinence who presents for a pessary fitting. Plan: She was fitted with a #2 incontinence ring pessary. She will remove once weekly . She will use estrogen.   Follow-up in 3 weeks for a pessary check or sooner as needed.  All questions were answered.    Selmer Dominion, NP

## 2022-09-26 DIAGNOSIS — F4323 Adjustment disorder with mixed anxiety and depressed mood: Secondary | ICD-10-CM | POA: Diagnosis not present

## 2022-09-30 DIAGNOSIS — Z1212 Encounter for screening for malignant neoplasm of rectum: Secondary | ICD-10-CM | POA: Diagnosis not present

## 2022-10-01 DIAGNOSIS — Z01419 Encounter for gynecological examination (general) (routine) without abnormal findings: Secondary | ICD-10-CM | POA: Diagnosis not present

## 2022-10-01 DIAGNOSIS — Z1231 Encounter for screening mammogram for malignant neoplasm of breast: Secondary | ICD-10-CM | POA: Diagnosis not present

## 2022-10-18 ENCOUNTER — Ambulatory Visit: Payer: BC Managed Care – PPO | Admitting: Obstetrics and Gynecology

## 2022-10-18 ENCOUNTER — Encounter: Payer: Self-pay | Admitting: Obstetrics and Gynecology

## 2022-10-18 VITALS — BP 129/80 | HR 75

## 2022-10-18 DIAGNOSIS — R350 Frequency of micturition: Secondary | ICD-10-CM | POA: Diagnosis not present

## 2022-10-18 DIAGNOSIS — N393 Stress incontinence (female) (male): Secondary | ICD-10-CM | POA: Diagnosis not present

## 2022-10-18 NOTE — Progress Notes (Signed)
[facility]   Subjective:     Chief Complaint:  Chief Complaint  Patient presents with   Pessay Check    BRENDOLYN BIRTS is a 52 y.o. female is here for pessary check and simple CMG.   History of Present Illness: TAREKA MARLETTE is a 52 y.o. female with stress incontinence who presents for a pessary check. She is using a size #2 incontinence ring pessary. The pessary has been working well, but she reports she has had it pop out during a bowel movement. She is using vaginal estrogen. She denies vaginal bleeding.  Past Medical History: Patient  has a past medical history of ADHD (attention deficit hyperactivity disorder), Anxiety, Depression, Drug overdose (05/26/12, 02/11/13), Fibroids, GERD (gastroesophageal reflux disease), Headache, Herpes, and Seasonal allergies.   Past Surgical History: She  has a past surgical history that includes Cesarean section; Tonsillectomy; right foot surgery (2007); Robotic assisted total hysterectomy (N/A, 08/10/2014); Bilateral salpingectomy (N/A, 08/10/2014); Cystoscopy (N/A, 08/10/2014); and Cholecystectomy (N/A, 10/15/2020).   Medications: She has a current medication list which includes the following prescription(s): acetaminophen, amphetamine-dextroamphetamine, esomeprazole, estradiol, estradiol, ibuprofen, l-methylfolate-algae, lorazepam, magnesium, ondansetron, rosuvastatin, ubrelvy, valacyclovir, vitamin d (ergocalciferol), and vortioxetine hbr.   Allergies: Patient is allergic to depakote [divalproex sodium], morphine and codeine, nubain [nalbuphine], prochlorperazine, and reglan [metoclopramide].   Social History: Patient  reports that she quit smoking about 19 years ago. Her smoking use included cigarettes. She has a 2.50 pack-year smoking history. She has never used smokeless tobacco. She reports current alcohol use of about 14.0 standard drinks of alcohol per week. She reports current drug use. Drug: Marijuana.      Objective:     Physical Exam: BP 129/80   Pulse 75   LMP 07/26/2014 (Approximate)  Gen: No apparent distress, A&O x 3. Detailed Urogynecologic Evaluation:  Pelvic Exam: Normal external female genitalia; Bartholin's and Skene's glands normal in appearance; urethral meatus normal in appearance, no urethral masses or discharge. The pessary was noted to be in place. It was removed and cleaned. Speculum exam revealed no lesions in the vagina. The pessary was replaced with a #3 ring with support and knob (Lot [id number]) . It was comfortable to the patient and fit well.     Assessment/Plan:    Assessment: Ms. Rosch is a 52 y.o. with stress incontinence here for a pessary check. She is doing well.  Plan: She will remove once a month if needed . She will continue to use estrogen. She will follow-up in 3 months for a pessary check or sooner as needed.  All questions were answered.

## 2022-10-23 DIAGNOSIS — F4323 Adjustment disorder with mixed anxiety and depressed mood: Secondary | ICD-10-CM | POA: Diagnosis not present

## 2022-11-13 DIAGNOSIS — F4323 Adjustment disorder with mixed anxiety and depressed mood: Secondary | ICD-10-CM | POA: Diagnosis not present

## 2022-12-10 DIAGNOSIS — F411 Generalized anxiety disorder: Secondary | ICD-10-CM | POA: Diagnosis not present

## 2022-12-10 DIAGNOSIS — F900 Attention-deficit hyperactivity disorder, predominantly inattentive type: Secondary | ICD-10-CM | POA: Diagnosis not present

## 2022-12-10 DIAGNOSIS — F3342 Major depressive disorder, recurrent, in full remission: Secondary | ICD-10-CM | POA: Diagnosis not present

## 2022-12-11 DIAGNOSIS — F4323 Adjustment disorder with mixed anxiety and depressed mood: Secondary | ICD-10-CM | POA: Diagnosis not present

## 2023-01-13 ENCOUNTER — Ambulatory Visit: Payer: BC Managed Care – PPO | Admitting: Obstetrics and Gynecology

## 2023-01-29 DIAGNOSIS — D2261 Melanocytic nevi of right upper limb, including shoulder: Secondary | ICD-10-CM | POA: Diagnosis not present

## 2023-01-29 DIAGNOSIS — D2262 Melanocytic nevi of left upper limb, including shoulder: Secondary | ICD-10-CM | POA: Diagnosis not present

## 2023-01-29 DIAGNOSIS — L814 Other melanin hyperpigmentation: Secondary | ICD-10-CM | POA: Diagnosis not present

## 2023-01-29 DIAGNOSIS — L249 Irritant contact dermatitis, unspecified cause: Secondary | ICD-10-CM | POA: Diagnosis not present

## 2023-01-29 DIAGNOSIS — F4323 Adjustment disorder with mixed anxiety and depressed mood: Secondary | ICD-10-CM | POA: Diagnosis not present

## 2023-02-14 ENCOUNTER — Encounter: Payer: Self-pay | Admitting: Obstetrics and Gynecology

## 2023-02-14 ENCOUNTER — Ambulatory Visit: Payer: BC Managed Care – PPO | Admitting: Obstetrics and Gynecology

## 2023-02-14 VITALS — BP 138/84 | HR 99

## 2023-02-14 DIAGNOSIS — N3281 Overactive bladder: Secondary | ICD-10-CM | POA: Diagnosis not present

## 2023-02-14 DIAGNOSIS — N393 Stress incontinence (female) (male): Secondary | ICD-10-CM | POA: Diagnosis not present

## 2023-02-14 NOTE — Patient Instructions (Addendum)
Send a message when you are ready to schedule sling surgery

## 2023-02-14 NOTE — Progress Notes (Unsigned)
Verbal consent was obtained to perform simple CMG procedure:   Prolapse was reduced using 2 large cotton swabs. Urethra was prepped with betadine and a 87F catheter was placed and bladder was drained completely. The bladder was then backfilled with sterile water by gravity.  First sensation: 20 First Desire: 37 Strong Desire: 120 Capacity: 450 Cough stress test was positive. Valsalva stress test was negative.  She was was allowed to void on her own.   Interpretation: CMG showed increased sensation, and normal cystometric capacity. Findings positive for stress incontinence, positive for detrusor overactivity.

## 2023-02-20 DIAGNOSIS — F4323 Adjustment disorder with mixed anxiety and depressed mood: Secondary | ICD-10-CM | POA: Diagnosis not present

## 2023-02-26 DIAGNOSIS — F4323 Adjustment disorder with mixed anxiety and depressed mood: Secondary | ICD-10-CM | POA: Diagnosis not present

## 2023-03-05 DIAGNOSIS — F4323 Adjustment disorder with mixed anxiety and depressed mood: Secondary | ICD-10-CM | POA: Diagnosis not present

## 2023-03-18 DIAGNOSIS — F4323 Adjustment disorder with mixed anxiety and depressed mood: Secondary | ICD-10-CM | POA: Diagnosis not present

## 2023-03-20 DIAGNOSIS — R7301 Impaired fasting glucose: Secondary | ICD-10-CM | POA: Diagnosis not present

## 2023-03-20 DIAGNOSIS — Z23 Encounter for immunization: Secondary | ICD-10-CM | POA: Diagnosis not present

## 2023-03-20 DIAGNOSIS — I700 Atherosclerosis of aorta: Secondary | ICD-10-CM | POA: Diagnosis not present

## 2023-03-27 DIAGNOSIS — F4323 Adjustment disorder with mixed anxiety and depressed mood: Secondary | ICD-10-CM | POA: Diagnosis not present

## 2023-04-03 DIAGNOSIS — F4323 Adjustment disorder with mixed anxiety and depressed mood: Secondary | ICD-10-CM | POA: Diagnosis not present

## 2023-05-01 DIAGNOSIS — F4323 Adjustment disorder with mixed anxiety and depressed mood: Secondary | ICD-10-CM | POA: Diagnosis not present

## 2023-05-15 DIAGNOSIS — F4323 Adjustment disorder with mixed anxiety and depressed mood: Secondary | ICD-10-CM | POA: Diagnosis not present

## 2023-06-05 DIAGNOSIS — F4323 Adjustment disorder with mixed anxiety and depressed mood: Secondary | ICD-10-CM | POA: Diagnosis not present

## 2023-06-11 DIAGNOSIS — F900 Attention-deficit hyperactivity disorder, predominantly inattentive type: Secondary | ICD-10-CM | POA: Diagnosis not present

## 2023-06-11 DIAGNOSIS — F3342 Major depressive disorder, recurrent, in full remission: Secondary | ICD-10-CM | POA: Diagnosis not present

## 2023-06-11 DIAGNOSIS — F411 Generalized anxiety disorder: Secondary | ICD-10-CM | POA: Diagnosis not present

## 2023-07-03 DIAGNOSIS — F4323 Adjustment disorder with mixed anxiety and depressed mood: Secondary | ICD-10-CM | POA: Diagnosis not present

## 2023-08-28 DIAGNOSIS — F4323 Adjustment disorder with mixed anxiety and depressed mood: Secondary | ICD-10-CM | POA: Diagnosis not present

## 2023-09-23 DIAGNOSIS — E559 Vitamin D deficiency, unspecified: Secondary | ICD-10-CM | POA: Diagnosis not present

## 2023-09-23 DIAGNOSIS — Z1212 Encounter for screening for malignant neoplasm of rectum: Secondary | ICD-10-CM | POA: Diagnosis not present

## 2023-09-23 DIAGNOSIS — R7301 Impaired fasting glucose: Secondary | ICD-10-CM | POA: Diagnosis not present

## 2023-09-23 DIAGNOSIS — E782 Mixed hyperlipidemia: Secondary | ICD-10-CM | POA: Diagnosis not present

## 2023-09-30 DIAGNOSIS — Z1331 Encounter for screening for depression: Secondary | ICD-10-CM | POA: Diagnosis not present

## 2023-09-30 DIAGNOSIS — I700 Atherosclerosis of aorta: Secondary | ICD-10-CM | POA: Diagnosis not present

## 2023-09-30 DIAGNOSIS — R82998 Other abnormal findings in urine: Secondary | ICD-10-CM | POA: Diagnosis not present

## 2023-09-30 DIAGNOSIS — Z0000 Encounter for general adult medical examination without abnormal findings: Secondary | ICD-10-CM | POA: Diagnosis not present

## 2023-09-30 DIAGNOSIS — R946 Abnormal results of thyroid function studies: Secondary | ICD-10-CM | POA: Diagnosis not present

## 2023-10-29 ENCOUNTER — Encounter: Payer: Self-pay | Admitting: Obstetrics and Gynecology

## 2023-10-29 DIAGNOSIS — Z01419 Encounter for gynecological examination (general) (routine) without abnormal findings: Secondary | ICD-10-CM | POA: Diagnosis not present

## 2023-10-29 DIAGNOSIS — Z1231 Encounter for screening mammogram for malignant neoplasm of breast: Secondary | ICD-10-CM | POA: Diagnosis not present

## 2023-11-28 ENCOUNTER — Encounter: Payer: Self-pay | Admitting: Obstetrics and Gynecology

## 2023-11-28 ENCOUNTER — Ambulatory Visit: Admitting: Obstetrics and Gynecology

## 2023-11-28 VITALS — BP 150/84 | HR 91 | Wt 152.4 lb

## 2023-11-28 DIAGNOSIS — R350 Frequency of micturition: Secondary | ICD-10-CM

## 2023-11-28 DIAGNOSIS — N393 Stress incontinence (female) (male): Secondary | ICD-10-CM | POA: Diagnosis not present

## 2023-11-28 NOTE — Progress Notes (Signed)
 [facility] Return Visit  SUBJECTIVE  History of Present Illness: Morgan [name] is a 53 y.o. female seen in follow-up for SUI.   Pessary is helping most of the time, but now she is interested in surgery. Removing the pessary about every 3 months, has been difficult for he to remove. She reports that if she, has to have a bowel movement, then sometimes she cannot empty with bladder until the BM is complete. She is not straining to defecate.   CMG showed increased sensation, and normal cystometric capacity. Findings positive for stress incontinence, positive for detrusor overactivity.    Past Medical History: Patient  has a past medical history of ADHD (attention deficit hyperactivity disorder), Anxiety, Depression, Drug overdose (05/26/12, 02/11/13), Fibroids, GERD (gastroesophageal reflux disease), Headache, Herpes, and Seasonal allergies.   Past Surgical History: She  has a past surgical history that includes Cesarean section; Tonsillectomy; right foot surgery (2007); Robotic assisted total hysterectomy (N/A, 08/10/2014); Bilateral salpingectomy (N/A, 08/10/2014); Cystoscopy (N/A, 08/10/2014); and Cholecystectomy (N/A, 10/15/2020).   Medications: She has a current medication list which includes the following prescription(s): acetaminophen , amphetamine -dextroamphetamine , esomeprazole , estradiol , estradiol , ibuprofen , l-methylfolate-algae, lorazepam , magnesium , ondansetron , rosuvastatin, ubrelvy, valacyclovir , vitamin d (ergocalciferol), and vortioxetine  hbr.   Allergies: Patient is allergic to depakote [divalproex sodium], morphine and codeine, nubain [nalbuphine], prochlorperazine , and reglan [metoclopramide].   Social History: Patient  reports that she quit smoking about 20 years ago. Her smoking use included cigarettes. She started smoking about 30 years ago. She has a 2.5 pack-year smoking history. She has never used smokeless tobacco. She reports current alcohol  use of about  14.0 standard drinks of alcohol  per week. She reports current drug use. Drug: Marijuana.     OBJECTIVE     Physical Exam: Vitals:   11/28/23 1020  BP: (!) 150/84  Pulse: 91  Weight: 152 lb 6.4 oz (69.1 kg)   Gen: No apparent distress, A&O x 3.  Detailed Urogynecologic Evaluation:  Pessary removed and cleaned. Normal external genitalia. On speculum, normal vaginal mucosa. On bimanual, no masses present. Pessary replaced.   POP-Q  -2.5                                            Aa   -2.5                                           Ba  -8                                              C   3                                            Gh  5.5                                            Pb  8.5  tvl   -2.5                                            Ap  -2.5                                            Bp                                                 D      ASSESSMENT AND PLAN    Morgan [name] is a 53 y.o. with:  1. Urinary frequency   2. SUI (stress urinary incontinence, female)    - We discussed that bowels can create pressure on the pelvic floor, so defecation can sometimes relieve the pressure and allow for urination. Could also be more pressure due to pessary.   Plan for surgery: Exam under anesthesia, midurethral sling, cystoscopy  - We reviewed the patient's specific anatomic and functional findings, with the assistance of diagrams, and together finalized the above procedure. The planned surgical procedures were discussed along with the surgical risks outlined below, which were also provided on a detailed handout. Additional treatment options including expectant management, conservative management, medical management were discussed where appropriate.  We reviewed the benefits and risks of each treatment option.   General Surgical Risks: For all procedures, there are risks of bleeding, infection, damage to surrounding organs  including but not limited to bowel, bladder, blood vessels, ureters and nerves, and need for further surgery if an injury were to occur. These risks are all low with minimally invasive surgery.   There are risks of numbness and weakness at any body site or buttock/rectal pain.  It is possible that baseline pain can be worsened by surgery, either with or without mesh. If surgery is vaginal, there is also a low risk of possible conversion to laparoscopy or open abdominal incision where indicated. Very rare risks include blood transfusion, blood clot, heart attack, pneumonia, or death.   There is also a risk of short-term postoperative urinary retention with need to use a catheter. About half of patients need to go home from surgery with a catheter, which is then later removed in the office. The risk of long-term need for a catheter is very low. There is also a risk of worsening of overactive bladder.   Sling: The effectiveness of a midurethral vaginal mesh sling is approximately 85%, and thus, there will be times when you may leak urine after surgery, especially if your bladder is full or if you have a strong cough. There is a balance between making the sling tight enough to treat your leakage but not too tight so that you have long-term difficulty emptying your bladder. A mesh sling will not directly treat overactive bladder/urge incontinence and may worsen it.  There is an FDA safety notification on vaginal mesh procedures for prolapse but NOT mesh slings. We have extensive experience and training with mesh placement and we have close postoperative follow up to identify any potential complications from mesh. It is important to realize that this mesh is a permanent implant that cannot be  easily removed. There are rare risks of mesh exposure (2-4%), pain with intercourse (0-7%), and infection (<1%). The risk of mesh exposure if more likely in a woman with risks for poor healing (prior radiation, poorly  controlled diabetes, or immunocompromised). The risk of new or worsened chronic pain after mesh implant is more common in women with baseline chronic pain and/or poorly controlled anxiety or depression. Approximately 2-4% of patients will experience longer-term post-operative voiding dysfunction that may require surgical revision of the sling. We also reviewed that postoperatively, her stream may not be as strong as before surgery.    - For preop Visit:  She is required to have a visit within 30 days of her surgery.    - Medical clearance: not required  - Anticoagulant use: No - Medicaid Hysterectomy form: No - Accepts blood transfusion: Yes - Expected length of stay: outpatient  Request sent for surgery scheduling.   Rosaline LOISE Caper, MD

## 2023-12-03 DIAGNOSIS — F900 Attention-deficit hyperactivity disorder, predominantly inattentive type: Secondary | ICD-10-CM | POA: Diagnosis not present

## 2023-12-03 DIAGNOSIS — F3342 Major depressive disorder, recurrent, in full remission: Secondary | ICD-10-CM | POA: Diagnosis not present

## 2023-12-03 DIAGNOSIS — F411 Generalized anxiety disorder: Secondary | ICD-10-CM | POA: Diagnosis not present

## 2023-12-17 ENCOUNTER — Encounter: Payer: Self-pay | Admitting: Obstetrics and Gynecology

## 2024-02-20 ENCOUNTER — Other Ambulatory Visit: Payer: Self-pay

## 2024-02-20 ENCOUNTER — Ambulatory Visit: Admitting: Obstetrics and Gynecology

## 2024-02-20 ENCOUNTER — Encounter: Payer: Self-pay | Admitting: Obstetrics and Gynecology

## 2024-02-20 VITALS — BP 147/89 | HR 82 | Ht 63.5 in | Wt 155.0 lb

## 2024-02-20 DIAGNOSIS — N393 Stress incontinence (female) (male): Secondary | ICD-10-CM

## 2024-02-20 DIAGNOSIS — Z01818 Encounter for other preprocedural examination: Secondary | ICD-10-CM

## 2024-02-20 MED ORDER — TRAMADOL HCL 50 MG PO TABS: 50.0000 mg | ORAL_TABLET | Freq: Three times a day (TID) | ORAL | 0 refills | Status: AC | PRN

## 2024-02-20 MED ORDER — POLYETHYLENE GLYCOL 3350 17 GM/SCOOP PO POWD: 17.0000 g | Freq: Every day | ORAL | 0 refills | Status: AC

## 2024-02-20 MED ORDER — ACETAMINOPHEN 500 MG PO TABS: 500.0000 mg | ORAL_TABLET | Freq: Four times a day (QID) | ORAL | 0 refills | Status: DC | PRN

## 2024-02-20 MED ORDER — IBUPROFEN 600 MG PO TABS: 600.0000 mg | ORAL_TABLET | Freq: Four times a day (QID) | ORAL | 0 refills | Status: AC | PRN

## 2024-02-20 MED ORDER — TRAMADOL HCL 50 MG PO TABS: 50.0000 mg | ORAL_TABLET | Freq: Three times a day (TID) | ORAL | 0 refills | Status: DC | PRN

## 2024-02-20 NOTE — Progress Notes (Signed)
 [facility] Return visit  Subjective Chief Complaint: Morgan [name] presents for a return visit  History of Present Illness: Morgan [name] is a 53 y.o. female who is scheduled to undergo Exam under anesthesia, midurethral sling, cystoscopy on 03/09/24 for SUI.  CMG showed increased sensation, and normal cystometric capacity. Findings positive for stress incontinence, positive for detrusor overactivity.   Past Medical History:  Diagnosis Date   ADHD (attention deficit hyperactivity disorder)    Anxiety    Depression    Drug overdose 05/26/12, 02/11/13   history x 2 - accidental drug overdose   Fibroids    GERD (gastroesophageal reflux disease)    Headache    Herpes    Seasonal allergies      Past Surgical History:  Procedure Laterality Date   BILATERAL SALPINGECTOMY N/A 08/10/2014   Procedure: BILATERAL SALPINGECTOMY;  Surgeon: Rosaline Luna, MD;  Location: [facility];  Service: Gynecology;  Laterality: N/A;   CESAREAN SECTION     x 1   CHOLECYSTECTOMY N/A 10/15/2020   Procedure: LAPAROSCOPIC CHOLECYSTECTOMY;  Surgeon: Rubin Calamity, MD;  Location: [facility] OR;  Service: General;  Laterality: N/A;   CYSTOSCOPY N/A 08/10/2014   Procedure: CYSTOSCOPY;  Surgeon: Rosaline Luna, MD;  Location: [facility];  Service: Gynecology;  Laterality: N/A;   right foot surgery  2007   ROBOTIC ASSISTED TOTAL HYSTERECTOMY N/A 08/10/2014   Procedure: ROBOTIC ASSISTED TOTAL HYSTERECTOMY , REMOVAL OF IUD;  Surgeon: Rosaline Luna, MD;  Location: [facility];  Service: Gynecology;  Laterality: N/A;   TONSILLECTOMY      is allergic to depakote [divalproex sodium], morphine and codeine, nubain [nalbuphine], prochlorperazine , and reglan [metoclopramide].   Family History  Problem Relation Age of Onset   Alzheimer's disease Mother    Ulcerative colitis Mother    Colon cancer Neg Hx    Esophageal cancer Neg Hx    Pancreatic cancer Neg Hx    Stomach cancer Neg Hx    Liver disease Neg Hx      Social History   Tobacco Use   Smoking status: Former    Current packs/day: 0.00    Average packs/day: 0.3 packs/day for 10.0 years (2.5 ttl pk-yrs)    Types: Cigarettes    Start date: 04/15/1993    Quit date: 04/16/2003    Years since quitting: 20.8   Smokeless tobacco: Never  Vaping Use   Vaping status: Never Used  Substance Use Topics   Alcohol  use: Yes    Alcohol /week: 14.0 standard drinks of alcohol     Types: 14 Glasses of wine per week    Comment: daily wine/beer-currently holding alcohol  for the most part as of 04-30-17   Drug use: Yes    Types: Marijuana    Comment: past     Review of Systems was negative for a full 10 system review except as noted in the History of Present Illness.   Current Outpatient Medications:    acetaminophen  (TYLENOL ) 500 MG tablet, Take 1,000 mg by mouth every 6 (six) hours as needed for mild pain., Disp: , Rfl:    acetaminophen  (TYLENOL ) 500 MG tablet, Take 1 tablet (500 mg total) by mouth every 6 (six) hours as needed (pain)., Disp: 30 tablet, Rfl: 0   amphetamine -dextroamphetamine  (ADDERALL) 30 MG tablet, Take 30 mg by mouth 2 (two) times daily., Disp: , Rfl:    esomeprazole  (NEXIUM ) 40 MG capsule, Take 1 capsule (40 mg total) by mouth 2 (two) times daily before a meal. (Patient taking differently:  Take 20 mg by mouth daily.), Disp: 60 capsule, Rfl: 3   estradiol  (ESTRACE ) 0.1 MG/GM vaginal cream, Place 0.5 g vaginally 2 (two) times a week. Place 0.5g nightly for two weeks then twice a week after, Disp: 42.5 g, Rfl: 11   estradiol  (ESTRACE ) 1 MG tablet, Take 1.5 mg by mouth., Disp: , Rfl:    ibuprofen  (ADVIL ) 600 MG tablet, Take 1 tablet (600 mg total) by mouth every 6 (six) hours as needed., Disp: 30 tablet, Rfl: 0   ibuprofen  (ADVIL ,MOTRIN ) 200 MG tablet, Take 400 mg by mouth every 6 (six) hours as needed for pain (for pain)., Disp: , Rfl:    L-Methylfolate-Algae (DEPLIN 7.5 PO), Take 7.5 mg by mouth daily., Disp: , Rfl:    LORazepam   (ATIVAN ) 0.5 MG tablet, Take 0.5 mg by mouth daily as needed for anxiety., Disp: , Rfl:    Magnesium  250 MG TABS, Take 500 mg by mouth at bedtime., Disp: , Rfl:    ondansetron  (ZOFRAN ) 8 MG tablet, Take 4-8 mg by mouth every 8 (eight) hours as needed for nausea or vomiting., Disp: , Rfl:    polyethylene glycol powder (GLYCOLAX /MIRALAX ) 17 GM/SCOOP powder, Take 17 g by mouth daily for 15 days. Drink 17g (1 scoop) dissolved in water  per day., Disp: 255 g, Rfl: 0   rosuvastatin (CRESTOR) 5 MG tablet, Take 5 mg by mouth once a week., Disp: , Rfl:    UBRELVY 100 MG TABS, Take 100 tablets by mouth as directed. For migraines, Disp: , Rfl:    valACYclovir  (VALTREX ) 500 MG tablet, Take 500 mg by mouth daily as needed (for out breaks)., Disp: , Rfl:    Vitamin D, Ergocalciferol, (DRISDOL) 1.25 MG (50000 UNIT) CAPS capsule, Take 50,000 Units by mouth once a week., Disp: , Rfl:    vortioxetine  HBr (TRINTELLIX ) 20 MG TABS tablet, Take 20 mg by mouth daily., Disp: , Rfl:    traMADol  (ULTRAM ) 50 MG tablet, Take 1 tablet (50 mg total) by mouth every 8 (eight) hours as needed for up to 5 days., Disp: 15 tablet, Rfl: 0   Objective Vitals:   02/20/24 1217 02/20/24 1220  BP: (!) 147/85 (!) 147/89  Pulse: 85 82    Gen: NAD CV: S1 S2 RRR Lungs: Clear to auscultation bilaterally Abd: soft, nontender   Previous Pelvic Exam showed:  POP-Q   -2.5                                            Aa   -2.5                                           Ba   -8                                              C    3                                            Gh   5.5  Pb   8.5                                            tvl    -2.5                                            Ap   -2.5                                            Bp                                                  D         Assessment/ Plan  Assessment: The patient is a 53 y.o. year old scheduled to  undergo Exam under anesthesia, midurethral sling, cystoscopy.   Plan: General Surgical Consent: The patient has previously been counseled on alternative treatments, and the decision by the patient and provider was to proceed with the procedure listed above.  For all procedures, there are risks of bleeding, infection, damage to surrounding organs including but not limited to bowel, bladder, blood vessels, ureters and nerves, and need for further surgery if an injury were to occur. These risks are all low with minimally invasive surgery.   There are risks of numbness and weakness at any body site or buttock/rectal pain.  It is possible that baseline pain can be worsened by surgery, either with or without mesh. If surgery is vaginal, there is also a low risk of possible conversion to laparoscopy or open abdominal incision where indicated. Very rare risks include blood transfusion, blood clot, heart attack, pneumonia, or death.   There is also a risk of short-term postoperative urinary retention with need to use a catheter. About half of patients need to go home from surgery with a catheter, which is then later removed in the office. The risk of long-term need for a catheter is very low. There is also a risk of worsening of overactive bladder.   Sling: The effectiveness of a midurethral vaginal mesh sling is approximately 85%, and thus, there will be times when you may leak urine after surgery, especially if your bladder is full or if you have a strong cough. There is a balance between making the sling tight enough to treat your leakage but not too tight so that you have long-term difficulty emptying your bladder. A mesh sling will not directly treat overactive bladder/urge incontinence and may worsen it.  There is an FDA safety notification on vaginal mesh procedures for prolapse but NOT mesh slings. We have extensive experience and training with mesh placement and we have close postoperative follow up to  identify any potential complications from mesh. It is important to realize that this mesh is a permanent implant that cannot be easily removed. There are rare risks of mesh exposure (2-4%), pain with intercourse (0-7%), and infection (<1%). The risk of mesh exposure if more likely in a woman with risks for poor healing (prior radiation, poorly  controlled diabetes, or immunocompromised). The risk of new or worsened chronic pain after mesh implant is more common in women with baseline chronic pain and/or poorly controlled anxiety or depression. Approximately 2-4% of patients will experience longer-term post-operative voiding dysfunction that may require surgical revision of the sling. We also reviewed that postoperatively, her stream may not be as strong as before surgery.     Pre-operative instructions:  She was instructed to not take Aspirin/NSAIDs x 7days prior to surgery.  Antibiotic prophylaxis was ordered as indicated.  Catheter use: Patient will go home with foley if needed after post-operative voiding trial.  Post-operative instructions:  She was provided with specific post-operative instructions, including precautions and signs/symptoms for which we would recommend contacting us , in addition to daytime and after-hours contact phone numbers. This was provided on a handout.   Post-operative medications: Prescriptions for motrin , tylenol , miralax , and tramadol  were sent to her pharmacy. Discussed using ibuprofen  and tylenol  on a schedule to limit use of narcotics.   Laboratory testing:  no labs needed  Preoperative clearance:  She does not require surgical clearance.    Post-operative follow-up:  A post-operative appointment will be made for 6 weeks from the date of surgery. If she needs a post-operative nurse visit for a voiding trial, that will be set up after she leaves the hospital.    Patient will call the clinic or use MyChart should anything change or any new issues arise.   Rosaline LOISE Caper, MD

## 2024-02-20 NOTE — H&P (Signed)
 [facility] Pre Op H&P  Subjective Chief Complaint: Morgan [name] presents for a return visit  History of Present Illness: Morgan [name] is a 53 y.o. female who is scheduled to undergo Exam under anesthesia, midurethral sling, cystoscopy on 03/09/24 for SUI.  CMG showed increased sensation, and normal cystometric capacity. Findings positive for stress incontinence, positive for detrusor overactivity.   Past Medical History:  Diagnosis Date   ADHD (attention deficit hyperactivity disorder)    Anxiety    Depression    Drug overdose 05/26/12, 02/11/13   history x 2 - accidental drug overdose   Fibroids    GERD (gastroesophageal reflux disease)    Headache    Herpes    Seasonal allergies      Past Surgical History:  Procedure Laterality Date   BILATERAL SALPINGECTOMY N/A 08/10/2014   Procedure: BILATERAL SALPINGECTOMY;  Surgeon: Rosaline Luna, MD;  Location: [facility];  Service: Gynecology;  Laterality: N/A;   CESAREAN SECTION     x 1   CHOLECYSTECTOMY N/A 10/15/2020   Procedure: LAPAROSCOPIC CHOLECYSTECTOMY;  Surgeon: Rubin Calamity, MD;  Location: [facility] OR;  Service: General;  Laterality: N/A;   CYSTOSCOPY N/A 08/10/2014   Procedure: CYSTOSCOPY;  Surgeon: Rosaline Luna, MD;  Location: [facility];  Service: Gynecology;  Laterality: N/A;   right foot surgery  2007   ROBOTIC ASSISTED TOTAL HYSTERECTOMY N/A 08/10/2014   Procedure: ROBOTIC ASSISTED TOTAL HYSTERECTOMY , REMOVAL OF IUD;  Surgeon: Rosaline Luna, MD;  Location: [facility];  Service: Gynecology;  Laterality: N/A;   TONSILLECTOMY      is allergic to depakote [divalproex sodium], morphine and codeine, nubain [nalbuphine], prochlorperazine , and reglan [metoclopramide].   Family History  Problem Relation Age of Onset   Alzheimer's disease Mother    Ulcerative colitis Mother    Colon cancer Neg Hx    Esophageal cancer Neg Hx    Pancreatic cancer Neg Hx    Stomach cancer Neg Hx    Liver disease Neg Hx      Social History   Tobacco Use   Smoking status: Former    Current packs/day: 0.00    Average packs/day: 0.3 packs/day for 10.0 years (2.5 ttl pk-yrs)    Types: Cigarettes    Start date: 04/15/1993    Quit date: 04/16/2003    Years since quitting: 20.8   Smokeless tobacco: Never  Vaping Use   Vaping status: Never Used  Substance Use Topics   Alcohol  use: Yes    Alcohol /week: 14.0 standard drinks of alcohol     Types: 14 Glasses of wine per week    Comment: daily wine/beer-currently holding alcohol  for the most part as of 04-30-17   Drug use: Yes    Types: Marijuana    Comment: past     Review of Systems was negative for a full 10 system review except as noted in the History of Present Illness.  No current facility-administered medications for this encounter.  Current Outpatient Medications:    acetaminophen  (TYLENOL ) 500 MG tablet, Take 1,000 mg by mouth every 6 (six) hours as needed for mild pain., Disp: , Rfl:    acetaminophen  (TYLENOL ) 500 MG tablet, Take 1 tablet (500 mg total) by mouth every 6 (six) hours as needed (pain)., Disp: 30 tablet, Rfl: 0   amphetamine -dextroamphetamine  (ADDERALL) 30 MG tablet, Take 30 mg by mouth 2 (two) times daily., Disp: , Rfl:    esomeprazole  (NEXIUM ) 40 MG capsule, Take 1 capsule (40 mg total) by mouth 2 (two)  times daily before a meal. (Patient taking differently: Take 20 mg by mouth daily.), Disp: 60 capsule, Rfl: 3   estradiol  (ESTRACE ) 0.1 MG/GM vaginal cream, Place 0.5 g vaginally 2 (two) times a week. Place 0.5g nightly for two weeks then twice a week after, Disp: 42.5 g, Rfl: 11   estradiol  (ESTRACE ) 1 MG tablet, Take 1.5 mg by mouth., Disp: , Rfl:    ibuprofen  (ADVIL ) 600 MG tablet, Take 1 tablet (600 mg total) by mouth every 6 (six) hours as needed., Disp: 30 tablet, Rfl: 0   ibuprofen  (ADVIL ,MOTRIN ) 200 MG tablet, Take 400 mg by mouth every 6 (six) hours as needed for pain (for pain)., Disp: , Rfl:    L-Methylfolate-Algae (DEPLIN 7.5  PO), Take 7.5 mg by mouth daily., Disp: , Rfl:    LORazepam  (ATIVAN ) 0.5 MG tablet, Take 0.5 mg by mouth daily as needed for anxiety., Disp: , Rfl:    Magnesium  250 MG TABS, Take 500 mg by mouth at bedtime., Disp: , Rfl:    ondansetron  (ZOFRAN ) 8 MG tablet, Take 4-8 mg by mouth every 8 (eight) hours as needed for nausea or vomiting., Disp: , Rfl:    polyethylene glycol powder (GLYCOLAX /MIRALAX ) 17 GM/SCOOP powder, Take 17 g by mouth daily for 15 days. Drink 17g (1 scoop) dissolved in water  per day., Disp: 255 g, Rfl: 0   rosuvastatin (CRESTOR) 5 MG tablet, Take 5 mg by mouth once a week., Disp: , Rfl:    traMADol  (ULTRAM ) 50 MG tablet, Take 1 tablet (50 mg total) by mouth every 8 (eight) hours as needed for up to 5 days., Disp: 15 tablet, Rfl: 0   UBRELVY 100 MG TABS, Take 100 tablets by mouth as directed. For migraines, Disp: , Rfl:    valACYclovir  (VALTREX ) 500 MG tablet, Take 500 mg by mouth daily as needed (for out breaks)., Disp: , Rfl:    Vitamin D, Ergocalciferol, (DRISDOL) 1.25 MG (50000 UNIT) CAPS capsule, Take 50,000 Units by mouth once a week., Disp: , Rfl:    vortioxetine  HBr (TRINTELLIX ) 20 MG TABS tablet, Take 20 mg by mouth daily., Disp: , Rfl:    Objective There were no vitals filed for this visit.   Gen: NAD CV: S1 S2 RRR Lungs: Clear to auscultation bilaterally Abd: soft, nontender   Previous Pelvic Exam showed:  POP-Q   -2.5                                            Aa   -2.5                                           Ba   -8                                              C    3                                            Gh   5.5  Pb   8.5                                            tvl    -2.5                                            Ap   -2.5                                            Bp                                                  D         Assessment/ Plan  The patient is a 53 y.o. year old with  SUI scheduled to undergo Exam under anesthesia, midurethral sling, cystoscopy.    Rosaline LOISE Caper, MD

## 2024-03-03 ENCOUNTER — Encounter (HOSPITAL_COMMUNITY): Payer: Self-pay | Admitting: Obstetrics and Gynecology

## 2024-03-03 NOTE — Progress Notes (Signed)
 Spoke w/ via phone for pre-op interview--- Morgan [name] needs dos---- NONE        [name] results------ COVID test -----patient states asymptomatic no test needed Arrive at -------0800 NPO after MN NO Solid Food.   Pre-Surgery Ensure or G2:  Med rec completed Medications to take morning of surgery ----- Nexium , Ativan -PRN. Migraine meds if needed. Diabetic medication -----  GLP1 agonist last dose: GLP1 instructions:  Patient instructed no nail polish to be worn day of surgery Patient instructed to bring photo id and insurance card day of surgery Patient aware to have Driver (ride ) / caregiver    for 24 hours after surgery - Husband Morgan [name] Patient Special Instructions ----- Pre-Op special Instructions -----  Patient verbalized understanding of instructions that were given at this phone interview. Patient denies chest pain, sob, fever, cough at the interview.

## 2024-03-09 ENCOUNTER — Ambulatory Visit (HOSPITAL_COMMUNITY): Admitting: Anesthesiology

## 2024-03-09 ENCOUNTER — Ambulatory Visit (HOSPITAL_COMMUNITY)
Admission: RE | Admit: 2024-03-09 | Discharge: 2024-03-09 | Disposition: A | Discharge status: Home / Self Care | Attending: Obstetrics and Gynecology | Admitting: Obstetrics and Gynecology

## 2024-03-09 ENCOUNTER — Encounter (HOSPITAL_COMMUNITY)
Admission: RE | Disposition: A | Discharge status: Home / Self Care | Payer: Self-pay | Source: Home / Self Care | Attending: Obstetrics and Gynecology

## 2024-03-09 ENCOUNTER — Telehealth: Payer: Self-pay | Admitting: Obstetrics and Gynecology

## 2024-03-09 ENCOUNTER — Encounter (HOSPITAL_COMMUNITY): Payer: Self-pay | Admitting: Obstetrics and Gynecology

## 2024-03-09 DIAGNOSIS — F419 Anxiety disorder, unspecified: Secondary | ICD-10-CM | POA: Diagnosis not present

## 2024-03-09 DIAGNOSIS — F909 Attention-deficit hyperactivity disorder, unspecified type: Secondary | ICD-10-CM | POA: Insufficient documentation

## 2024-03-09 DIAGNOSIS — Z79899 Other long term (current) drug therapy: Secondary | ICD-10-CM | POA: Diagnosis not present

## 2024-03-09 DIAGNOSIS — K219 Gastro-esophageal reflux disease without esophagitis: Secondary | ICD-10-CM | POA: Diagnosis not present

## 2024-03-09 DIAGNOSIS — Z87891 Personal history of nicotine dependence: Secondary | ICD-10-CM | POA: Insufficient documentation

## 2024-03-09 DIAGNOSIS — F32A Depression, unspecified: Secondary | ICD-10-CM | POA: Diagnosis not present

## 2024-03-09 DIAGNOSIS — R519 Headache, unspecified: Secondary | ICD-10-CM | POA: Insufficient documentation

## 2024-03-09 DIAGNOSIS — N393 Stress incontinence (female) (male): Secondary | ICD-10-CM | POA: Diagnosis not present

## 2024-03-09 DIAGNOSIS — Z79818 Long term (current) use of other agents affecting estrogen receptors and estrogen levels: Secondary | ICD-10-CM | POA: Insufficient documentation

## 2024-03-09 HISTORY — PX: CYSTOSCOPY: SHX5120

## 2024-03-09 HISTORY — PX: BLADDER SUSPENSION: SHX72

## 2024-03-09 HISTORY — PX: EXAM UNDER ANESTHESIA, PELVIC: SHX7461

## 2024-03-09 SURGERY — CREATION, URETHRAL SLING, RETROPUBIC APPROACH, USING POLYPROPYLENE TAPE
Anesthesia: General

## 2024-03-09 MED ORDER — ONDANSETRON HCL 4 MG/2ML IJ SOLN
INTRAMUSCULAR | Status: DC | PRN
  Administered 2024-03-09: 4 mg via INTRAVENOUS

## 2024-03-09 MED ORDER — LIDOCAINE 2% (20 MG/ML) 5 ML SYRINGE
INTRAMUSCULAR | Status: AC
  Filled 2024-03-09: qty 5

## 2024-03-09 MED ORDER — ACETAMINOPHEN 500 MG PO TABS
ORAL_TABLET | ORAL | Status: AC
  Filled 2024-03-09: qty 2

## 2024-03-09 MED ORDER — CEFAZOLIN SODIUM-DEXTROSE 2-4 GM/100ML-% IV SOLN
INTRAVENOUS | Status: AC
  Filled 2024-03-09: qty 100

## 2024-03-09 MED ORDER — UBRELVY 100 MG PO TABS: 1.0000 | ORAL_TABLET | ORAL | Status: AC

## 2024-03-09 MED ORDER — LACTATED RINGERS IV SOLN: INTRAVENOUS | Status: DC

## 2024-03-09 MED ORDER — ONDANSETRON HCL 4 MG/2ML IJ SOLN
INTRAMUSCULAR | Status: AC
  Filled 2024-03-09: qty 2

## 2024-03-09 MED ORDER — AMISULPRIDE (ANTIEMETIC) 5 MG/2ML IV SOLN: 10.0000 mg | Freq: Once | INTRAVENOUS | Status: DC | PRN

## 2024-03-09 MED ORDER — LIDOCAINE-EPINEPHRINE 1 %-1:100000 IJ SOLN
INTRAMUSCULAR | Status: AC
  Filled 2024-03-09: qty 3

## 2024-03-09 MED ORDER — OXYCODONE HCL 5 MG PO TABS: 5.0000 mg | ORAL_TABLET | Freq: Once | ORAL | Status: DC | PRN

## 2024-03-09 MED ORDER — ACETAMINOPHEN 500 MG PO TABS
1000.0000 mg | ORAL_TABLET | ORAL | Status: AC
  Administered 2024-03-09: 1000 mg via ORAL

## 2024-03-09 MED ORDER — MIDAZOLAM HCL 2 MG/2ML IJ SOLN
INTRAMUSCULAR | Status: AC
  Filled 2024-03-09: qty 2

## 2024-03-09 MED ORDER — LIDOCAINE 2% (20 MG/ML) 5 ML SYRINGE
INTRAMUSCULAR | Status: DC | PRN
  Administered 2024-03-09: 60 mg via INTRAVENOUS

## 2024-03-09 MED ORDER — PROPOFOL 10 MG/ML IV BOLUS
INTRAVENOUS | Status: AC
  Filled 2024-03-09: qty 20

## 2024-03-09 MED ORDER — PHENAZOPYRIDINE HCL 100 MG PO TABS
200.0000 mg | ORAL_TABLET | ORAL | Status: AC
  Administered 2024-03-09: 200 mg via ORAL

## 2024-03-09 MED ORDER — SODIUM CHLORIDE 0.9 % IR SOLN
Status: DC | PRN
  Administered 2024-03-09: 200 mL via INTRAVESICAL

## 2024-03-09 MED ORDER — 0.9 % SODIUM CHLORIDE (POUR BTL) OPTIME
TOPICAL | Status: DC | PRN
  Administered 2024-03-09: 1000 mL

## 2024-03-09 MED ORDER — FENTANYL CITRATE (PF) 250 MCG/5ML IJ SOLN
INTRAMUSCULAR | Status: DC | PRN
  Administered 2024-03-09: 25 ug via INTRAVENOUS
  Administered 2024-03-09: 50 ug via INTRAVENOUS
  Administered 2024-03-09: 25 ug via INTRAVENOUS

## 2024-03-09 MED ORDER — BUPIVACAINE HCL (PF) 0.25 % IJ SOLN
INTRAMUSCULAR | Status: AC
  Filled 2024-03-09: qty 30

## 2024-03-09 MED ORDER — CEFAZOLIN SODIUM-DEXTROSE 2-4 GM/100ML-% IV SOLN
2.0000 g | INTRAVENOUS | Status: AC
  Administered 2024-03-09: 2 g via INTRAVENOUS

## 2024-03-09 MED ORDER — DEXAMETHASONE SOD PHOSPHATE PF 10 MG/ML IJ SOLN
INTRAMUSCULAR | Status: DC | PRN
Start: 2024-03-09 — End: 2024-03-09
  Administered 2024-03-09: 10 mg via INTRAVENOUS

## 2024-03-09 MED ORDER — MIDAZOLAM HCL (PF) 2 MG/2ML IJ SOLN
INTRAMUSCULAR | Status: DC | PRN
  Administered 2024-03-09: 2 mg via INTRAVENOUS

## 2024-03-09 MED ORDER — CHLORHEXIDINE GLUCONATE 0.12 % MT SOLN
OROMUCOSAL | Status: AC
  Filled 2024-03-09: qty 15

## 2024-03-09 MED ORDER — OXYCODONE HCL 5 MG/5ML PO SOLN: 5.0000 mg | Freq: Once | ORAL | Status: DC | PRN

## 2024-03-09 MED ORDER — PHENAZOPYRIDINE HCL 100 MG PO TABS
ORAL_TABLET | ORAL | Status: AC
  Filled 2024-03-09: qty 2

## 2024-03-09 MED ORDER — ONDANSETRON HCL 4 MG/2ML IJ SOLN: 4.0000 mg | Freq: Once | INTRAMUSCULAR | Status: DC | PRN

## 2024-03-09 MED ORDER — FENTANYL CITRATE (PF) 100 MCG/2ML IJ SOLN
INTRAMUSCULAR | Status: AC
  Filled 2024-03-09: qty 2

## 2024-03-09 MED ORDER — ORAL CARE MOUTH RINSE: 15.0000 mL | Freq: Once | OROMUCOSAL | Status: AC

## 2024-03-09 MED ORDER — PROPOFOL 10 MG/ML IV BOLUS
INTRAVENOUS | Status: DC | PRN
  Administered 2024-03-09: 190 mg via INTRAVENOUS
  Administered 2024-03-09: 50 mg via INTRAVENOUS

## 2024-03-09 MED ORDER — KETOROLAC TROMETHAMINE 30 MG/ML IJ SOLN
INTRAMUSCULAR | Status: AC
  Filled 2024-03-09: qty 1

## 2024-03-09 MED ORDER — FENTANYL CITRATE (PF) 100 MCG/2ML IJ SOLN: 25.0000 ug | INTRAMUSCULAR | Status: DC | PRN

## 2024-03-09 MED ORDER — LIDOCAINE-EPINEPHRINE 1 %-1:100000 IJ SOLN
INTRAMUSCULAR | Status: DC | PRN
  Administered 2024-03-09: 10 mL

## 2024-03-09 MED ORDER — EPHEDRINE SULFATE-NACL 50-0.9 MG/10ML-% IV SOSY
PREFILLED_SYRINGE | INTRAVENOUS | Status: DC | PRN
  Administered 2024-03-09 (×2): 5 mg via INTRAVENOUS

## 2024-03-09 MED ORDER — CHLORHEXIDINE GLUCONATE 0.12 % MT SOLN
15.0000 mL | Freq: Once | OROMUCOSAL | Status: AC
  Administered 2024-03-09: 15 mL via OROMUCOSAL

## 2024-03-09 MED ORDER — KETOROLAC TROMETHAMINE 30 MG/ML IJ SOLN
INTRAMUSCULAR | Status: DC | PRN
  Administered 2024-03-09: 30 mg via INTRAVENOUS

## 2024-03-09 SURGICAL SUPPLY — 29 items
BLADE CLIPPER SENSICLIP SURGIC (BLADE) ×2 IMPLANT
BLADE SURG 15 STRL LF DISP TIS (BLADE) ×2 IMPLANT
DERMABOND ADVANCED .7 DNX12 (GAUZE/BANDAGES/DRESSINGS) ×2 IMPLANT
ELECTRODE REM PT RTRN 9FT ADLT (ELECTROSURGICAL) IMPLANT
GAUZE 4X4 16PLY ~~LOC~~+RFID DBL (SPONGE) IMPLANT
GLOVE BIOGEL PI IND STRL 6.5 (GLOVE) ×2 IMPLANT
GLOVE BIOGEL PI MICRO STRL 6 (GLOVE) ×2 IMPLANT
GOWN STRL REUS W/ TWL LRG LVL3 (GOWN DISPOSABLE) ×2 IMPLANT
GOWN STRL REUS W/TWL LRG LVL3 (GOWN DISPOSABLE) ×2 IMPLANT
HIBICLENS CHG 4% 4OZ BTL (MISCELLANEOUS) ×2 IMPLANT
HOLDER FOLEY CATH W/STRAP (MISCELLANEOUS) ×2 IMPLANT
IV 0.9% NACL 1000 ML (IV SOLUTION) IMPLANT
KIT TURNOVER KIT B (KITS) ×2 IMPLANT
MANIFOLD NEPTUNE II (INSTRUMENTS) ×2 IMPLANT
NDL HYPO 22X1.5 SAFETY MO (MISCELLANEOUS) ×2 IMPLANT
PACK VAGINAL WOMENS (CUSTOM PROCEDURE TRAY) ×2 IMPLANT
PAD OB MATERNITY 11 LF (PERSONAL CARE ITEMS) ×2 IMPLANT
RETRACTOR LONE STAR DISPOSABLE (INSTRUMENTS) ×2 IMPLANT
RETRACTOR STAY HOOK 5MM (MISCELLANEOUS) ×2 IMPLANT
SET IRRIG Y-TYPE CYSTO (SET/KITS/TRAYS/PACK) ×2 IMPLANT
SLEEVE SCD COMPRESS KNEE MED (STOCKING) ×2 IMPLANT
SLING TVT EXACT (Sling) IMPLANT
SOLN 0.9% NACL POUR BTL 1000ML (IV SOLUTION) ×2 IMPLANT
SUCTION TUBE FRAZIER 10FR DISP (SUCTIONS) ×2 IMPLANT
SURGIFLO W/THROMBIN 8M KIT (HEMOSTASIS) IMPLANT
SUT VIC AB 2-0 SH 27XBRD (SUTURE) ×2 IMPLANT
SYR BULB EAR ULCER 3OZ GRN STR (SYRINGE) ×2 IMPLANT
TOWEL GREEN STERILE FF (TOWEL DISPOSABLE) ×2 IMPLANT
TRAY FOLEY W/BAG SLVR 14FR (SET/KITS/TRAYS/PACK) ×2 IMPLANT

## 2024-03-09 NOTE — Anesthesia Preprocedure Evaluation (Addendum)
 Anesthesia Evaluation  Patient identified by MRN, date of birth, ID band Patient awake    Reviewed: Allergy & Precautions, NPO status , Patient's Chart, lab work & pertinent test results  History of Anesthesia Complications Negative for: history of anesthetic complications  Airway Mallampati: II  TM Distance: >3 FB Neck ROM: Full    Dental  (+) Dental Advisory Given   Pulmonary Current Smoker and Patient abstained from smoking.   Pulmonary exam normal        Cardiovascular negative cardio ROS Normal cardiovascular exam     Neuro/Psych  Headaches PSYCHIATRIC DISORDERS Anxiety Depression       GI/Hepatic ,GERD  Medicated and Controlled,,(+)     substance abuse  alcohol  use and marijuana use  Endo/Other  negative endocrine ROS    Renal/GU negative Renal ROS  Female GU complaint     Musculoskeletal negative musculoskeletal ROS (+)    Abdominal   Peds  (+) ADHD Hematology negative hematology ROS (+)   Anesthesia Other Findings Hx accidental drug OD HSV  Reproductive/Obstetrics                              Anesthesia Physical Anesthesia Plan  ASA: 2  Anesthesia Plan: General   Post-op Pain Management: Tylenol  PO (pre-op)*   Induction: Intravenous  PONV Risk Score and Plan: 3 and Treatment may vary due to age or medical condition, Ondansetron , Dexamethasone  and Midazolam   Airway Management Planned: LMA  Additional Equipment: None  Intra-op Plan:   Post-operative Plan: Extubation in OR  Informed Consent: I have reviewed the patients History and Physical, chart, labs and discussed the procedure including the risks, benefits and alternatives for the proposed anesthesia with the patient or authorized representative who has indicated his/her understanding and acceptance.     Dental advisory given  Plan Discussed with: CRNA and Anesthesiologist  Anesthesia Plan Comments:           Anesthesia Quick Evaluation

## 2024-03-09 NOTE — Transfer of Care (Signed)
 Immediate Anesthesia Transfer of Care Note  Patient: Morgan [name]  Procedure(s) Performed: CREATION, URETHRAL SLING, RETROPUBIC APPROACH, USING POLYPROPYLENE TAPE CYSTOSCOPY EXAM UNDER ANESTHESIA, PELVIC  Patient Location: PACU  Anesthesia Type:General  Level of Consciousness: awake, alert , patient cooperative, and responds to stimulation  Airway & Oxygen Therapy: Patient Spontanous Breathing and Patient connected to face mask oxygen  Post-op Assessment: Report given to RN, Post -op Vital signs reviewed and stable, and Patient moving all extremities X 4  Post vital signs: Reviewed and stable  Last Vitals:  Vitals Value Taken Time  BP 148/97 03/09/24 09:15  Temp    Pulse 123 03/09/24 09:24  Resp 17 03/09/24 09:24  SpO2 100 % 03/09/24 09:24  Vitals shown include unfiled device data.  Last Pain:  Vitals:   03/09/24 0756  TempSrc: Oral  PainSc: 0-No pain      Patients Stated Pain Goal: 3 (03/09/24 0756)  Complications: No notable events documented.

## 2024-03-09 NOTE — Op Note (Signed)
 Operative Note  Preoperative Diagnosis: stress urinary incontinence  Postoperative Diagnosis: same  Procedures performed:  Midurethral sling (TVT exact), cystoscopy  Implants:  Implant Name Type Inv. Item Serial No. Manufacturer Lot No. LRB No. Used Action  SLING TVT EXACT - [id number] Sling SLING TVT [employer] [id number]  1 Implanted    Attending Surgeon: Rosaline Caper, MD  Anesthesia: General LMA  Findings:  On cystoscopy, normal bladder and urethral mucosa without injury or lesion. Brisk bilateral ureteral efflux present.    Specimens: none  Estimated blood loss: 50 mL  IV fluids: 100 mL  Urine output: 10 mL  Complications: none  Procedure in Detail:  After informed consent was obtained, the patient was taken to the operating room where she was placed under anesthesia.  She was then placed in the dorsal lithotomy position with allen stirrups,  and prepped and draped in the usual sterile fashion.  A strap was placed across her upper abdomen on top of her gown so it was not in direct contact with her skin.  Care was taken to avoid hyperflexion or hyperextension of her upper and lower extremities. A foley catheter was placed.  A lonestar self-retraining retractor was placed with 4 stay hooks. The mid urethral area was located on the anterior vaginal wall.  Two Allis clamps were placed at the level of the midurethra. 1% lidocaine  with epinephrine  was injected into the vaginal mucosa. A vertical incision was made between the two clamps using a 15-blade scalpel.  Using sharp dissection, Metzenbaum scissors were used to make a periurethral tunnel from the vaginal incision towards the pubic rami bilaterally for the future sling tracts. The bladder was ensured to be empty. The trocar and attached sling were introduced into the right side of the periurethral vaginal incision, just inferior to the pubic symphysis on the right side. The trocar was guided through the endopelvic  fascia and directly vertically.  While hugging the cephalad surface of the pubic bone, the trocar was guided out through the abdomen 2 fingerbreadths lateral to midline at the level of the pubic symphysis on the ipsilateral side. The trocar was placed on the left side in a similar fashion.  A 70-degree cystoscope was introduced, and 360-degree inspection revealed no trauma or trocars in the bladder, with brisk bilateral ureteral efflux.  The bladder was drained and the cystoscope was removed.  The Foley catheter was reinserted.  The sling was brought to lie beneath the mid-urethra.  A needle driver was placed behind the sling to ensure no tension.   The plastic sheath was removed from the sling and the distal ends of the sling were trimmed just below the level of the skin incisions.  Tension-free positioning of the sling was confirmed. Vaginal inspection revealed no vaginotomy or sling perforations of the mucosa.  The vaginal mucosal edges were reapproximated using 2-0 Vicryl.  The vagina was copiously irrigated.  Hemostasis was again noted. Vaginal packing not placed. The suprapubic sling incisions were closed with Dermabond. The patient tolerated the procedure well.  She was awakened from anesthesia and transferred to the recovery room in stable condition. All needle and sponge counts were correct x 2.    Rosaline LOISE Caper, MD

## 2024-03-09 NOTE — OR Nursing (Signed)
 Voiding trial begins.  275 cc  sterile water  instilled into bladder via cathetor.  Cathetor balloon deflated and cathetor removed.  Patient assisted to restroom.  Voided approx 175 cc clear light yellow urine.  Patient also missed hat for some of voiding.  Patient voided a second time which was not measured.  Bladder scanned for 5cc then again for 14cc.  Dr. Marilynne notified and instructed to d/c home without a cathetor.  Patient voided a 3rd time prior to discharge but was not measured  Andree Kerns rn

## 2024-03-09 NOTE — Telephone Encounter (Signed)
 Morgan [name] underwent midurethral sling, cystoscopy on 03/09/24.   She passed her voiding trial.  was backfilled into the bladder Voided  PVR by bladder scan was 14ml.   She was discharged without a catheter. Please call her for a routine post op check . Thanks!  Morgan LOISE Caper, MD

## 2024-03-09 NOTE — Anesthesia Procedure Notes (Signed)
 Procedure Name: LMA Insertion Date/Time: 03/09/2024 8:28 AM  Performed by: Jolynn Mage, CRNAPre-anesthesia Checklist: Patient identified, Emergency Drugs available, Suction available and Patient being monitored Patient Re-evaluated:Patient Re-evaluated prior to induction Oxygen Delivery Method: Circle system utilized Preoxygenation: Pre-oxygenation with 100% oxygen Induction Type: IV induction Ventilation: Mask ventilation without difficulty LMA: LMA flexible inserted LMA Size: 4.0 Number of attempts: 1 Placement Confirmation: positive ETCO2 and breath sounds checked- equal and bilateral Tube secured with: Tape Dental Injury: Teeth and Oropharynx as per pre-operative assessment

## 2024-03-09 NOTE — Discharge Instructions (Addendum)
 POST OPERATIVE INSTRUCTIONS  General Instructions Recovery (not bed rest) will last approximately 2 weeks Walking is encouraged, but refrain from strenuous exercise/ housework/ heavy lifting for 2 weeks. No lifting >10lbs  Nothing in the vagina- NO intercourse, tampons or douching for 6 weeks Bathing:  Do not submerge in water  (NO swimming, bath, hot tub, etc) until after your postop visit. You can shower starting the day after surgery.  No driving until you are not taking narcotic pain medicine and until your pain is well enough controlled that you can slam on the breaks or make sudden movements if needed.   Taking your medications Please take your acetaminophen  and ibuprofen  on a schedule for the first 48 hours. Take 600mg  ibuprofen , then take 500mg  acetaminophen  3 hours later, then continue to alternate ibuprofen  and acetaminophen . That way you are taking each type of medication every 6 hours. Take the prescribed narcotic (oxycodone , tramadol , etc) as needed, with a maximum being every 4 hours.  Take a stool softener daily to keep your stools soft and preventing you from straining. If you have diarrhea, you decrease your stool softener. This is explained more below. We have prescribed you Miralax .  Reasons to Call the Nurse (see last page for phone numbers) Heavy Bleeding (changing your pad every 1-2 hours) Persistent nausea/vomiting Fever (100.4 degrees or more) Incision problems (pus or other fluid coming out, redness, warmth, increased pain)  Things to Expect After Surgery Mild to Moderate pain is normal during the first day or two after surgery. If prescribed, take Ibuprofen  or Tylenol  first and use the stronger medicine for "break-through" pain. You can overlap these medicines because they work differently.   Constipation   To Prevent Constipation:  Eat a well-balanced diet including protein, grains, fresh fruit and vegetables.  Drink plenty of fluids. Walk regularly.  Depending on  specific instructions from your physician: take Miralax  daily and additionally you can add a stool softener (colace/ docusate) and fiber supplement. Continue as long as you're on pain medications.   To Treat Constipation:  If you do not have a bowel movement in 2 days after surgery, you can take 2 Tbs of Milk of Magnesia 1-2 times a day until you have a bowel movement. If diarrhea occurs, decrease the amount or stop the laxative. If no results with Milk of Magnesia, you can drink a bottle of magnesium  citrate which you can purchase over the counter.  Fatigue:  This is a normal response to surgery and will improve with time.  Plan frequent rest periods throughout the day.  Gas Pain:  This is very common but can also be very painful! Drink warm liquids such as herbal teas, bouillon or soup. Walking will help you pass more gas.  Mylicon or Gas-X can be taken over the counter.  Leaking Urine:  Varying amounts of leakage may occur after surgery.  This should improve with time. Your bladder needs at least 3 months to recover from surgery. If you leak after surgery, be sure to mention this to your doctor at your post-op visit. If you were taking medications for overactive bladder prior to surgery, be sure to restart the medications immediately after surgery.  Incisions: If you have incisions on your abdomen, the skin glue will dissolve on its own over time. It is ok to gently rinse with soap and water  over these incisions but do not scrub.  Catheter Approximately 50% of patients are unable to urinate after surgery and need to go home with a  catheter. This allows your bladder to rest so it can return to full function. If you go home with a catheter, the office will call to set up a voiding trial a few days after surgery. For most patients, by this visit, they are able to urinate on their own. Long term catheter use is rare.   Return to Work  As work demands and recovery times vary widely, it is hard to  predict when you will want to return to work. If you have a desk job with no strenuous physical activity, and if you would like to return sooner than generally recommended, discuss this with your provider or call our office.   Post op concerns  For non-emergent issues, please call the Urogynecology Nurse. Please leave a message and someone will contact you within one business day.  You can also send a message through MyChart.   AFTER HOURS (After 5:00 PM and on weekends):  For urgent matters that cannot wait until the next business day. Call our office [phone] and connect to the doctor on call.  Please reserve this for important issues.   **FOR ANY TRUE EMERGENCY ISSUES CALL 911 OR GO TO THE NEAREST EMERGENCY ROOM.** Please inform our office or the doctor on call of any emergency.     APPOINTMENTS: Call [phone]  Post Anesthesia Home Care Instructions  Activity: Get plenty of rest for the remainder of the day. A responsible adult should stay with you for 24 hours following the procedure.  For the next 24 hours, DO NOT: -Drive a car -Advertising copywriter -Drink alcoholic beverages -Take any medication unless instructed by your physician -Make any legal decisions or sign important papers.  Meals: Start with liquid foods such as gelatin or soup. Progress to regular foods as tolerated. Avoid greasy, spicy, heavy foods. If nausea and/or vomiting occur, drink only clear liquids until the nausea and/or vomiting subsides. Call your physician if vomiting continues.  Special Instructions/Symptoms: Your throat may feel dry or sore from the anesthesia or the breathing tube placed in your throat during surgery. If this causes discomfort, gargle with warm salt water . The discomfort should disappear within 24 hours.

## 2024-03-09 NOTE — Interval H&P Note (Signed)
 History and Physical Interval Note:  03/09/2024 8:01 AM  Morgan [name]  has presented today for surgery, with the diagnosis of stress incontinence.  The various methods of treatment have been discussed with the patient and family. After consideration of risks, benefits and other options for treatment, the patient has consented to  Procedure(s): CREATION, URETHRAL SLING, RETROPUBIC APPROACH, USING POLYPROPYLENE TAPE (N/A) CYSTOSCOPY (N/A) EXAM UNDER ANESTHESIA, PELVIC as a surgical intervention.  The patient's history has been reviewed, patient examined, no change in status, stable for surgery.  I have reviewed the patient's chart and labs.  Questions were answered to the patient's satisfaction.     Morgan [name]

## 2024-03-09 NOTE — Anesthesia Postprocedure Evaluation (Signed)
 Anesthesia Post Note  Patient: Morgan [name]  Procedure(s) Performed: CREATION, URETHRAL SLING, RETROPUBIC APPROACH, USING POLYPROPYLENE TAPE CYSTOSCOPY EXAM UNDER ANESTHESIA, PELVIC     Patient location during evaluation: PACU Anesthesia Type: General Level of consciousness: awake and alert Pain management: pain level controlled Vital Signs Assessment: post-procedure vital signs reviewed and stable Respiratory status: spontaneous breathing, nonlabored ventilation and respiratory function stable Cardiovascular status: stable and blood pressure returned to baseline Anesthetic complications: no   No notable events documented.  Last Vitals:  Vitals:   03/09/24 0950 03/09/24 1028  BP:    Pulse: 96   Resp: (!) 7   Temp:  36.6 C  SpO2: 100%     Last Pain:  Vitals:   03/09/24 0756  TempSrc: Oral  PainSc: 0-No pain                 Debby FORBES Like

## 2024-03-10 ENCOUNTER — Encounter (HOSPITAL_COMMUNITY): Payer: Self-pay | Admitting: Obstetrics and Gynecology

## 2024-03-10 NOTE — Telephone Encounter (Signed)
 Morgan [name]  underwent Creation, Urethral Sling, Retropubic Approach, Using Polypropylene Tape, Cystoscopy, and Exam Under Anesthesia, Pelvic  on 03/09/2024  with [] Dr Marilynne [] Dr Guadlupe.  The patient reports that her pain is controlled.  She is taking [] No Medication [x] Acetaminophen  500mg  every 6 hours [x] Ibuprofen  600mg  every 6 hours or []  Prescribed Narcotic.  Her pain level is 3[] with medication [] Without medication is.   She denies vaginal bleeding.  The patient is tolerating PO fluids and solids. She has not had a bowel movement and is taking Miralax  for a bowel regimen. She is not passing gas.  She was discharged without a catheter.   []  Discharged without a catheter, the patient does feel as if she is emptying her bladder.  [] Discharged with a catheter, the patient is not having any concerns with her catheter.  She will return for a voiding trial. [] Verified scheduled date and time with patient.  She does not having any additional questions.  Reviewed Post operative instructions as needed to answer additional questions.   CC'd note to patient's provider.

## 2024-03-17 ENCOUNTER — Telehealth: Payer: Self-pay

## 2024-03-17 NOTE — Telephone Encounter (Signed)
 Patient is requesting a RTW note but is requesting for a half day 12/4-12/5. I'm not sure if we can send half day returns. Please advise.

## 2024-04-20 ENCOUNTER — Ambulatory Visit: Admitting: Obstetrics and Gynecology

## 2024-04-20 ENCOUNTER — Encounter: Payer: Self-pay | Admitting: Obstetrics and Gynecology

## 2024-04-20 VITALS — BP 127/74 | HR 84

## 2024-04-20 DIAGNOSIS — Z48816 Encounter for surgical aftercare following surgery on the genitourinary system: Secondary | ICD-10-CM

## 2024-04-20 DIAGNOSIS — N393 Stress incontinence (female) (male): Secondary | ICD-10-CM

## 2024-04-20 DIAGNOSIS — Z9889 Other specified postprocedural states: Secondary | ICD-10-CM

## 2024-04-20 NOTE — Progress Notes (Signed)
 [facility]  Date of Visit: 04/20/2024  History of Present Illness: Morgan [name] is a 54 y.o. female scheduled today for a post-operative visit.   Surgery: s/p Midurethral sling (TVT exact), cystoscopy on 03/09/24  She passed her postoperative void trial.   Postoperative course has been uncomplicated.   Today she reports she is having leakage a few times per week with cough but overall has seen improvement in amount of leakage. Denies vaginal bleeding.   UTI in the last 6 weeks? No  Pain? No  She has returned to her normal activity (except for postop restrictions) Vaginal bulge? N/a Stress incontinence: Yes - few times a week, has improved Urgency/frequency: sometimes Urge incontinence: No  Voiding dysfunction: No  Bowel issues: No    Medications: She has a current medication list which includes the following prescription(s): acetaminophen , amphetamine -dextroamphetamine , esomeprazole , estradiol , estradiol , fexofenadine, ibuprofen , ibuprofen , l-methylfolate-algae, lorazepam , magnesium , ondansetron , rosuvastatin, ubrelvy , valacyclovir , vitamin d (ergocalciferol), and vortioxetine  hbr.   Allergies: Patient is allergic to depakote [divalproex sodium], morphine and codeine, nubain [nalbuphine], prochlorperazine , and reglan [metoclopramide].   Physical Exam: BP 127/74   Pulse 84   LMP 07/26/2014    Suprapubic Incisions: healing well.  Pelvic Examination: Vagina: Incisions healing well. Sutures are present at incision line and there is not granulation tissue. No visible or palpable mesh.  ---------------------------------------------------------  Assessment and Plan:  1. Post-operative state   2. SUI (stress urinary incontinence, female)     - Overall good improvement of incontinence with sling.  - We discussed the possible addition of urethral bulking but will wait a few more months for complete healing then reassess.  - Can resume regular activity including exercise  and intercourse,  if desired.   Follow up 2 months  Rosaline LOISE Caper, MD

## 2024-04-26 ENCOUNTER — Encounter: Payer: Self-pay | Admitting: *Deleted

## 2024-06-21 ENCOUNTER — Ambulatory Visit: Admitting: Obstetrics and Gynecology
